# Patient Record
Sex: Male | Born: 1935 | Hispanic: No | Marital: Married | Smoking: Former smoker
Health system: Southern US, Community
[De-identification: ages and names within clinical notes are randomized; demographics above are authoritative.]

## PROBLEM LIST (undated history)

## (undated) DIAGNOSIS — E1140 Type 2 diabetes mellitus with diabetic neuropathy, unspecified: Secondary | ICD-10-CM

## (undated) DIAGNOSIS — D510 Vitamin B12 deficiency anemia due to intrinsic factor deficiency: Secondary | ICD-10-CM

## (undated) DIAGNOSIS — I2510 Atherosclerotic heart disease of native coronary artery without angina pectoris: Secondary | ICD-10-CM

## (undated) DIAGNOSIS — N183 Chronic kidney disease, stage 3 unspecified: Secondary | ICD-10-CM

## (undated) DIAGNOSIS — E119 Type 2 diabetes mellitus without complications: Secondary | ICD-10-CM

## (undated) DIAGNOSIS — C439 Malignant melanoma of skin, unspecified: Secondary | ICD-10-CM

## (undated) DIAGNOSIS — E11319 Type 2 diabetes mellitus with unspecified diabetic retinopathy without macular edema: Secondary | ICD-10-CM

## (undated) HISTORY — PX: CORONARY ARTERY BYPASS GRAFT: SHX141

## (undated) HISTORY — PX: APPENDECTOMY: SHX54

## (undated) HISTORY — PX: OTHER SURGICAL HISTORY: SHX169

## (undated) HISTORY — PX: FOOT SURGERY: SHX648

## (undated) HISTORY — PX: CARDIAC SURGERY: SHX584

## (undated) HISTORY — PX: TONSILLECTOMY: SUR1361

---

## 2004-08-23 ENCOUNTER — Emergency Department: Payer: Self-pay | Admitting: Emergency Medicine

## 2005-11-03 ENCOUNTER — Emergency Department: Payer: Self-pay | Admitting: Emergency Medicine

## 2005-11-03 ENCOUNTER — Other Ambulatory Visit: Payer: Self-pay

## 2006-06-13 ENCOUNTER — Emergency Department: Payer: Self-pay | Admitting: Emergency Medicine

## 2008-06-17 ENCOUNTER — Ambulatory Visit: Payer: Self-pay | Admitting: Cardiology

## 2008-06-17 ENCOUNTER — Ambulatory Visit: Payer: Self-pay | Admitting: Unknown Physician Specialty

## 2008-06-21 ENCOUNTER — Ambulatory Visit: Payer: Self-pay | Admitting: Unknown Physician Specialty

## 2008-10-19 ENCOUNTER — Inpatient Hospital Stay: Payer: Self-pay | Admitting: Internal Medicine

## 2008-11-03 ENCOUNTER — Encounter: Payer: Self-pay | Admitting: Internal Medicine

## 2008-11-28 ENCOUNTER — Encounter: Payer: Self-pay | Admitting: Internal Medicine

## 2010-04-30 HISTORY — PX: TOE AMPUTATION: SHX809

## 2010-07-17 ENCOUNTER — Other Ambulatory Visit: Payer: Self-pay | Admitting: Podiatry

## 2012-01-03 ENCOUNTER — Ambulatory Visit: Payer: Self-pay | Admitting: Oncology

## 2012-01-03 LAB — CBC CANCER CENTER
Basophil #: 0.1 x10 3/mm (ref 0.0–0.1)
Eosinophil %: 3.1 %
HCT: 41.4 % (ref 40.0–52.0)
HGB: 13.8 g/dL (ref 13.0–18.0)
Lymphocyte #: 3.4 x10 3/mm (ref 1.0–3.6)
Lymphocyte %: 51.2 %
MCV: 95 fL (ref 80–100)
Monocyte %: 8.5 %
Neutrophil #: 2.4 x10 3/mm (ref 1.4–6.5)
Neutrophil %: 36.2 %
RBC: 4.34 10*6/uL — ABNORMAL LOW (ref 4.40–5.90)
RDW: 13.7 % (ref 11.5–14.5)
WBC: 6.6 x10 3/mm (ref 3.8–10.6)

## 2012-01-03 LAB — COMPREHENSIVE METABOLIC PANEL
Albumin: 3.9 g/dL (ref 3.4–5.0)
Bilirubin,Total: 0.4 mg/dL (ref 0.2–1.0)
Calcium, Total: 9.3 mg/dL (ref 8.5–10.1)
Chloride: 105 mmol/L (ref 98–107)
Co2: 32 mmol/L (ref 21–32)
Creatinine: 1.45 mg/dL — ABNORMAL HIGH (ref 0.60–1.30)
EGFR (African American): 52 — ABNORMAL LOW
EGFR (Non-African Amer.): 45 — ABNORMAL LOW
Potassium: 4.3 mmol/L (ref 3.5–5.1)
SGOT(AST): 20 U/L (ref 15–37)
SGPT (ALT): 19 U/L (ref 12–78)
Sodium: 142 mmol/L (ref 136–145)

## 2012-01-09 ENCOUNTER — Ambulatory Visit: Payer: Self-pay | Admitting: Oncology

## 2012-01-10 ENCOUNTER — Ambulatory Visit: Payer: Self-pay | Admitting: Podiatry

## 2012-01-21 ENCOUNTER — Other Ambulatory Visit: Payer: Self-pay | Admitting: Podiatry

## 2012-01-29 ENCOUNTER — Ambulatory Visit: Payer: Self-pay | Admitting: Oncology

## 2012-02-20 LAB — COMPREHENSIVE METABOLIC PANEL
Albumin: 3.6 g/dL (ref 3.4–5.0)
Anion Gap: 8 (ref 7–16)
BUN: 15 mg/dL (ref 7–18)
Bilirubin,Total: 0.4 mg/dL (ref 0.2–1.0)
Calcium, Total: 9.2 mg/dL (ref 8.5–10.1)
Creatinine: 1.27 mg/dL (ref 0.60–1.30)
Glucose: 128 mg/dL — ABNORMAL HIGH (ref 65–99)
Potassium: 4.3 mmol/L (ref 3.5–5.1)
SGOT(AST): 18 U/L (ref 15–37)
SGPT (ALT): 17 U/L (ref 12–78)
Total Protein: 6.8 g/dL (ref 6.4–8.2)

## 2012-02-20 LAB — CBC CANCER CENTER
Basophil #: 0.1 x10 3/mm (ref 0.0–0.1)
Basophil %: 1.1 %
Eosinophil #: 0.2 x10 3/mm (ref 0.0–0.7)
Eosinophil %: 3 %
HGB: 13.1 g/dL (ref 13.0–18.0)
Lymphocyte #: 2.4 x10 3/mm (ref 1.0–3.6)
Lymphocyte %: 45.9 %
Monocyte %: 8.7 %
Neutrophil #: 2.1 x10 3/mm (ref 1.4–6.5)
Neutrophil %: 41.3 %
Platelet: 157 x10 3/mm (ref 150–440)
RBC: 4.11 10*6/uL — ABNORMAL LOW (ref 4.40–5.90)
WBC: 5.1 x10 3/mm (ref 3.8–10.6)

## 2012-02-29 ENCOUNTER — Ambulatory Visit: Payer: Self-pay | Admitting: Oncology

## 2012-06-28 ENCOUNTER — Ambulatory Visit: Payer: Self-pay | Admitting: Oncology

## 2012-07-29 ENCOUNTER — Ambulatory Visit: Payer: Self-pay | Admitting: Oncology

## 2012-07-29 LAB — COMPREHENSIVE METABOLIC PANEL
Alkaline Phosphatase: 92 U/L (ref 50–136)
Bilirubin,Total: 0.5 mg/dL (ref 0.2–1.0)
Calcium, Total: 8.5 mg/dL (ref 8.5–10.1)
Co2: 29 mmol/L (ref 21–32)
EGFR (African American): 50 — ABNORMAL LOW
EGFR (Non-African Amer.): 43 — ABNORMAL LOW
Glucose: 219 mg/dL — ABNORMAL HIGH (ref 65–99)
Potassium: 4.2 mmol/L (ref 3.5–5.1)
SGOT(AST): 18 U/L (ref 15–37)
SGPT (ALT): 16 U/L (ref 12–78)
Sodium: 140 mmol/L (ref 136–145)

## 2012-07-29 LAB — CBC CANCER CENTER
Basophil #: 0.1 x10 3/mm (ref 0.0–0.1)
Basophil %: 1.1 %
HCT: 39 % — ABNORMAL LOW (ref 40.0–52.0)
Lymphocyte #: 1.6 x10 3/mm (ref 1.0–3.6)
Lymphocyte %: 35.1 %
MCH: 31.9 pg (ref 26.0–34.0)
MCHC: 34.4 g/dL (ref 32.0–36.0)
MCV: 93 fL (ref 80–100)
Monocyte #: 0.3 x10 3/mm (ref 0.2–1.0)
Neutrophil #: 2.4 x10 3/mm (ref 1.4–6.5)
Neutrophil %: 52.8 %
Platelet: 150 x10 3/mm (ref 150–440)
RDW: 14.3 % (ref 11.5–14.5)

## 2012-08-28 ENCOUNTER — Ambulatory Visit: Payer: Self-pay | Admitting: Oncology

## 2013-01-26 ENCOUNTER — Ambulatory Visit: Payer: Self-pay | Admitting: Oncology

## 2013-01-28 ENCOUNTER — Ambulatory Visit: Payer: Self-pay | Admitting: Oncology

## 2013-01-28 LAB — CBC CANCER CENTER
Basophil #: 0.1 x10 3/mm (ref 0.0–0.1)
HCT: 38.9 % — ABNORMAL LOW (ref 40.0–52.0)
HGB: 13.2 g/dL (ref 13.0–18.0)
Lymphocyte #: 2.6 x10 3/mm (ref 1.0–3.6)
Lymphocyte %: 39.5 %
MCHC: 33.9 g/dL (ref 32.0–36.0)
Monocyte #: 0.6 x10 3/mm (ref 0.2–1.0)
Neutrophil %: 47.5 %
Platelet: 173 x10 3/mm (ref 150–440)
RBC: 4.12 10*6/uL — ABNORMAL LOW (ref 4.40–5.90)
RDW: 13.7 % (ref 11.5–14.5)
WBC: 6.6 x10 3/mm (ref 3.8–10.6)

## 2013-01-28 LAB — COMPREHENSIVE METABOLIC PANEL
Albumin: 3.5 g/dL (ref 3.4–5.0)
Anion Gap: 10 (ref 7–16)
BUN: 25 mg/dL — ABNORMAL HIGH (ref 7–18)
Creatinine: 1.44 mg/dL — ABNORMAL HIGH (ref 0.60–1.30)
EGFR (African American): 52 — ABNORMAL LOW
Osmolality: 293 (ref 275–301)
Potassium: 4.1 mmol/L (ref 3.5–5.1)
SGOT(AST): 16 U/L (ref 15–37)
Sodium: 143 mmol/L (ref 136–145)
Total Protein: 6.7 g/dL (ref 6.4–8.2)

## 2013-02-28 ENCOUNTER — Ambulatory Visit: Payer: Self-pay | Admitting: Oncology

## 2013-06-22 ENCOUNTER — Ambulatory Visit: Payer: Self-pay | Admitting: Podiatry

## 2013-06-22 LAB — BASIC METABOLIC PANEL
Anion Gap: 1 — ABNORMAL LOW (ref 7–16)
BUN: 16 mg/dL (ref 7–18)
CALCIUM: 9.2 mg/dL (ref 8.5–10.1)
CHLORIDE: 108 mmol/L — AB (ref 98–107)
Co2: 31 mmol/L (ref 21–32)
Creatinine: 1.01 mg/dL (ref 0.60–1.30)
EGFR (African American): >60
EGFR (Non-African Amer.): >60
Glucose: 83 mg/dL (ref 65–99)
OSMOLALITY: 280 (ref 275–301)
POTASSIUM: 4.8 mmol/L (ref 3.5–5.1)
Sodium: 140 mmol/L (ref 136–145)

## 2013-06-22 LAB — CBC WITH DIFFERENTIAL/PLATELET
BASOS PCT: 1.2 %
Basophil #: 0.1 10*3/uL (ref 0.0–0.1)
EOS PCT: 2.6 %
Eosinophil #: 0.2 10*3/uL (ref 0.0–0.7)
HCT: 36.5 % — AB (ref 40.0–52.0)
HGB: 12.4 g/dL — ABNORMAL LOW (ref 13.0–18.0)
LYMPHS ABS: 2.2 10*3/uL (ref 1.0–3.6)
Lymphocyte %: 35.8 %
MCH: 31.7 pg (ref 26.0–34.0)
MCHC: 33.8 g/dL (ref 32.0–36.0)
MCV: 94 fL (ref 80–100)
Monocyte #: 0.5 x10 3/mm (ref 0.2–1.0)
Monocyte %: 8.2 %
NEUTROS PCT: 52.2 %
Neutrophil #: 3.2 10*3/uL (ref 1.4–6.5)
Platelet: 157 10*3/uL (ref 150–440)
RBC: 3.9 10*6/uL — AB (ref 4.40–5.90)
RDW: 14.6 % — ABNORMAL HIGH (ref 11.5–14.5)
WBC: 6 10*3/uL (ref 3.8–10.6)

## 2013-06-25 ENCOUNTER — Ambulatory Visit: Payer: Self-pay | Admitting: Podiatry

## 2013-07-27 ENCOUNTER — Other Ambulatory Visit: Payer: Self-pay | Admitting: Podiatry

## 2013-07-29 ENCOUNTER — Ambulatory Visit: Payer: Self-pay | Admitting: Oncology

## 2013-07-31 LAB — WOUND CULTURE

## 2013-08-18 LAB — CBC CANCER CENTER
Basophil #: 0.1 x10 3/mm (ref 0.0–0.1)
Basophil %: 1.1 %
Eosinophil #: 0.3 x10 3/mm (ref 0.0–0.7)
Eosinophil %: 5.2 %
HCT: 40 % (ref 40.0–52.0)
HGB: 13.4 g/dL (ref 13.0–18.0)
Lymphocyte #: 2.1 x10 3/mm (ref 1.0–3.6)
Lymphocyte %: 40.1 %
MCH: 31.2 pg (ref 26.0–34.0)
MCHC: 33.4 g/dL (ref 32.0–36.0)
MCV: 93 fL (ref 80–100)
Monocyte #: 0.5 x10 3/mm (ref 0.2–1.0)
Monocyte %: 9.8 %
NEUTROS PCT: 43.8 %
Neutrophil #: 2.3 x10 3/mm (ref 1.4–6.5)
PLATELETS: 141 x10 3/mm — AB (ref 150–440)
RBC: 4.28 10*6/uL — AB (ref 4.40–5.90)
RDW: 13.9 % (ref 11.5–14.5)
WBC: 5.2 x10 3/mm (ref 3.8–10.6)

## 2013-08-18 LAB — COMPREHENSIVE METABOLIC PANEL
ALBUMIN: 3.6 g/dL (ref 3.4–5.0)
ALK PHOS: 85 U/L
ANION GAP: 7 (ref 7–16)
BUN: 18 mg/dL (ref 7–18)
Bilirubin,Total: 0.4 mg/dL (ref 0.2–1.0)
CHLORIDE: 106 mmol/L (ref 98–107)
CO2: 30 mmol/L (ref 21–32)
CREATININE: 1.27 mg/dL (ref 0.60–1.30)
Calcium, Total: 9.2 mg/dL (ref 8.5–10.1)
EGFR (Non-African Amer.): 52 — ABNORMAL LOW
GLUCOSE: 154 mg/dL — AB (ref 65–99)
OSMOLALITY: 290 (ref 275–301)
Potassium: 4.2 mmol/L (ref 3.5–5.1)
SGOT(AST): 13 U/L — ABNORMAL LOW (ref 15–37)
SGPT (ALT): 12 U/L (ref 12–78)
Sodium: 143 mmol/L (ref 136–145)
Total Protein: 6.9 g/dL (ref 6.4–8.2)

## 2013-08-28 ENCOUNTER — Ambulatory Visit: Payer: Self-pay | Admitting: Oncology

## 2014-02-23 ENCOUNTER — Ambulatory Visit: Payer: Self-pay | Admitting: Oncology

## 2014-02-23 LAB — CBC CANCER CENTER
Basophil #: 0.1 x10 3/mm (ref 0.0–0.1)
Basophil %: 1.2 %
Eosinophil #: 0.2 x10 3/mm (ref 0.0–0.7)
Eosinophil %: 3.3 %
HCT: 38.8 % — AB (ref 40.0–52.0)
HGB: 12.7 g/dL — ABNORMAL LOW (ref 13.0–18.0)
LYMPHS ABS: 1.8 x10 3/mm (ref 1.0–3.6)
Lymphocyte %: 35.8 %
MCH: 31.9 pg (ref 26.0–34.0)
MCHC: 32.8 g/dL (ref 32.0–36.0)
MCV: 98 fL (ref 80–100)
Monocyte #: 0.4 x10 3/mm (ref 0.2–1.0)
Monocyte %: 8.5 %
NEUTROS PCT: 51.2 %
Neutrophil #: 2.6 x10 3/mm (ref 1.4–6.5)
PLATELETS: 152 x10 3/mm (ref 150–440)
RBC: 3.98 10*6/uL — ABNORMAL LOW (ref 4.40–5.90)
RDW: 13.9 % (ref 11.5–14.5)
WBC: 5.1 x10 3/mm (ref 3.8–10.6)

## 2014-02-23 LAB — COMPREHENSIVE METABOLIC PANEL
ALBUMIN: 3.5 g/dL (ref 3.4–5.0)
Alkaline Phosphatase: 81 U/L
Anion Gap: 5 — ABNORMAL LOW (ref 7–16)
BUN: 16 mg/dL (ref 7–18)
Bilirubin,Total: 0.4 mg/dL (ref 0.2–1.0)
Calcium, Total: 8.6 mg/dL (ref 8.5–10.1)
Chloride: 106 mmol/L (ref 98–107)
Co2: 32 mmol/L (ref 21–32)
Creatinine: 1.33 mg/dL — ABNORMAL HIGH (ref 0.60–1.30)
EGFR (African American): >60
EGFR (Non-African Amer.): 55 — ABNORMAL LOW
Glucose: 183 mg/dL — ABNORMAL HIGH (ref 65–99)
Osmolality: 291 (ref 275–301)
Potassium: 4.6 mmol/L (ref 3.5–5.1)
SGOT(AST): 21 U/L (ref 15–37)
SGPT (ALT): 15 U/L
SODIUM: 143 mmol/L (ref 136–145)
Total Protein: 6.7 g/dL (ref 6.4–8.2)

## 2014-02-28 ENCOUNTER — Ambulatory Visit: Payer: Self-pay | Admitting: Oncology

## 2014-08-17 NOTE — Op Note (Signed)
PATIENT NAME:  Matthew Mcguire, Matthew Mcguire MR#:  [id number] DATE OF BIRTH:  1928-08-01  DATE OF PROCEDURE:  01/10/2012  PREOPERATIVE DIAGNOSIS: Melanoma of the right great toe.   POSTOPERATIVE DIAGNOSIS: Melanoma of the right great toe.   PROCEDURE: Right inguinal lymph node biopsy.   SURGEON: Rochel Brome, MD   ANESTHESIA: General, local.   INDICATIONS: This 81 year old male has a history of a bleeding wound of the right great toe. Dr. Elvina Mattes did a curette biopsy which demonstrated malignant melanoma. His PET scan was only positive in the toe. Sentinel lymph node biopsy was recommended for further evaluation.   DESCRIPTION OF PROCEDURE: The patient had preoperative injection of radioactive technetium sulfur colloid in his foot. I reviewed the scan which demonstrated activity in the right groin. There was an intense focus and then just slightly cephalad to that was slightly less focus.   The patient was placed on the operating table in the supine position. His foot was prepared for surgery by Dr. Elvina Mattes. The right groin was prepared with ChloraPrep and draped in a sterile manner. Dr. Elvina Mattes did a right great toe amputation simultaneously as I did the right inguinal lymph node biopsy. We had separate instruments and separate scrub technicians.   The gamma counter was used in the groin just below the inguinal crease to demonstrate location of radioactivity. This was just about 1.5 cm cephalad to a purple mark which had been placed in the skin. A longitudinally-oriented 4 cm incision was made, carried down through subcutaneous tissues through superficial fascia. A number of small bleeding points were cauterized. The femoral pulse could be felt deep to the site of dissection. The dissection was carried further down into the groin alternating with the use of the gamma counter demonstrating location of radioactivity. There was a lymph node encountered which appeared to be more than a centimeter in dimension  and also some additional lymph node tissue just somewhat proximal to this. Both of these were excised in one portion of tissue which was approximately 2.5 to 3 cm in length and was approximately 1.2 cm wide and 1.2 cm from anterior to posterior. This was submitted in formalin for routine pathology. One clamped bleeding point was suture ligated with 4-0 chromic. Following this, the site was further scanned with the gamma counter, and the background count was zero; so there was no remaining radioactivity found in the groin. Next, the hemostasis was determined to be intact. The fascia was closed with interrupted 4-0 chromic sutures, and the skin was closed with a running 5-0 Monocryl subcuticular suture and Dermabond.   The patient tolerated the procedure satisfactorily, and with both procedures completed the patient was prepared for transfer to the recovery room. Dr. Elvina Mattes  will create a separate operative note describing his procedure.   ____________________________ J. Rochel Brome, MD jws:cbb D: 01/10/2012 11:59:26 ET T: 01/10/2012 12:55:49 ET JOB#: [id number]  cc: Loreli Dollar, MD, <Dictator> Loreli Dollar MD ELECTRONICALLY SIGNED 01/10/2012 17:40

## 2014-08-17 NOTE — H&P (Signed)
PATIENT NAME:  Matthew Mcguire, Matthew Mcguire MR#:  [id number] DATE OF BIRTH:  05-19-1928  DATE OF ADMISSION:  01/10/2012  HISTORY OF PRESENT ILLNESS: This 81 year old has a chief complaint of a bleeding sore on his right great toe. This has been bleeding intermittently for nearly a year. He says it typically bleeds about every other month. He has not had any pain or discomfort. Does have history of peripheral neuropathy. Has fractured the small toe but did not have any pain with that either. He just recently saw Dr. Elvina Mattes and had a portion of tissue of the right great toe removed with curette and submitted for pathology which demonstrated malignant melanoma. He has had consultation by Dr. Oliva Bustard who has recommended sentinel lymph node biopsy. He has also had PET scanning which showed no abnormal metabolic activity other than in the tip of the right great toe. Patient reports no foot or leg pain. Does tend to get some swelling in both ankles which is chronic.   PAST MEDICAL HISTORY:  1. Diabetes which he has been aware of for some 56 years, has some history of diabetic retinopathy and also diabetic peripheral neuropathy. 2. History of pernicious anemia.  3. History of hypothyroidism, although not currently on treatment.  4. History of myocardial infarction in 2010, had coronary artery bypass graft June 2010, recently saw Dr. Ubaldo Glassing in the office on 08/12 and at that time was reviewed that he had had some dizziness and his metoprolol and lisinopril and hydrochlorothiazide were stopped and his dizziness improved. He had had no recent chest pain or shortness of breath.   PAST SURGICAL HISTORY:  1. Appendectomy. 2. Tonsillectomy. 3. Carpal tunnel surgery on the left. 4. Coronary artery bypass 2010. 5. Bilateral cataract surgery.  6. Surgery for Dupuytren's contracture right ring and small finger 2003. 7. Excision of left olecranon bursa 2004. 8. Release Dupuytren contracture left little finger 2004.    MEDICATIONS:  1. Aspirin 81 mg daily. 2. Vitamin B12 1000 mcg intramuscular each month. 3. Lantus insulin 30 units sub-Q at bedtime. 4. Humalog 5 to 10 units sub-Q t.i.d.    DRUG ALLERGIES: None known.   SOCIAL HISTORY: Does not smoke. Does drink some whiskey.   FAMILY HISTORY: Positive for diabetes in his sister. His brother had stroke.   REVIEW OF SYSTEMS: He reports he has been eating well, moving his bowels satisfactorily, voiding satisfactorily. Has some mild difficulty with vision and hearing. He reports no recent acute illness such as cough, cold, sore throat. No dyspnea on exertion. No chest pain. He is moving his bowels satisfactorily, voiding satisfactorily. Does have some chronic bilateral ankle edema, numbness in both feet. Has not noted any recent sores or boils. Review of systems otherwise negative.    PHYSICAL EXAMINATION:  GENERAL: He is awake, alert, oriented, ambulatory, in no acute distress.   VITAL SIGNS: Blood pressure 128/68, pulse 50, respirations 12, oxygen saturation 98%.   SKIN: Warm, dry, tanned without rash.   HEENT: Pupils equal, reactive to light. Extraocular movements are intact. Sclerae clear. Pharynx clear.   NECK: No palpable mass.   LUNGS: Lungs sounds are clear.   HEART: Regular rhythm, S1, S2.   ABDOMEN: Obese and soft and nontender with no palpable mass. There is no inguinal adenopathy. Femoral pulses are strong and intact. There is no unusual swelling in the groin.   EXTREMITIES: Has mild bilateral ankle edema. His right dorsalis pedis pulse is intact. There is some loss of the medial aspect of his  great toenail with a healing wound of the nail bed. There is a trace of blood in the immediate area and some postbiopsy changes with scarring. His feet are anesthetic.   NEUROLOGIC: Awake, alert and oriented, moving all extremities   LABORATORY, DIAGNOSTIC AND RADIOLOGICAL DATA: I reviewed his sentinel lymph node scan which does demonstrate  activity in the groin consistent with lymph node and also there may be a second lymph node just about an inch away which is less apparent.  DIAGNOSIS: Melanoma of the right great toe.   NOTE: Dr. Elvina Mattes plans a right great toe amputation.   RECOMMENDATIONS: I recommended a sentinel lymph node biopsy. I discussed operation, care, risks and benefits with him in detail and have discussed doing that under general anesthesia and so we are arranging for his surgery.  ____________________________ Lenna Sciara. Rochel Brome, MD jws:cms D: 01/10/2012 10:52:21 ET T: 01/10/2012 11:09:38 ET JOB#: [id number]  cc: Loreli Dollar, MD, <Dictator> Loreli Dollar MD ELECTRONICALLY SIGNED 01/10/2012 17:40

## 2014-08-17 NOTE — Op Note (Signed)
PATIENT NAME:  CELVIN, TANEY MR#:  [id number] DATE OF BIRTH:  [birth year]  DATE OF PROCEDURE:  01/10/2012  PREOPERATIVE DIAGNOSIS: Malignant melanoma, right hallux nail area.   POSTOPERATIVE DIAGNOSIS: Malignant melanoma, right hallux nail area.  PROCEDURE PERFORMED:   1. Amputation to metatarsophalangeal joint level of the right hallux.  2. Sentinel node biopsy done by Dr. Tamala Julian in the right groin.   SURGEON: Albertine Patricia, DPM (first procedure)  SURGEON: Rochel Brome, MD  (second procedure)  ANESTHESIA: General with local assistance.   ESTIMATED BLOOD LOSS: Negligible.   HEMOSTASIS: Ankle tourniquet to 250 mmHg pressure.   DESCRIPTION OF PROCEDURE: The patient was brought to the Operating Room and placed on the Operating Room table in the supine position. After general anesthesia was achieved, I gave the patient some Marcaine around the base of the toe, a metatarsal block essentially in the first metatarsal area. The patient was then prepped and draped in the usual sterile manner both at the right foot and with Dr. Tamala Julian. He will dictate his procedure separately. After sterile prep and drape, attention was directed to the right great toe. A skin marker was used to measure approximately 5 cm proximal to the area. This allowed me to amputate the toe just distal to the MTP joint level. I left some skin to be able to close the wound primarily. At this point, the incision was taken down all the way to bone. The soft tissue was then freed away from the bone, including the extensor and flexor tendons. Joint level was identified, and tendon and capsular tissue was then released. The tibial and fibular sesamoid were also resected at this timeframe to allow less pressure submet one on the foot when he was walking on it. The toe was then sent for pathology along with the sesamoids. The area was then copiously irrigated. Clean margins were noted, and 3-0 Vicryl was used to close deep and superficial  fascia in a simple interrupted fashion. Skin was then closed with 3-0 nylon in a combination of horizontal mattress, vertical mattress, and simple interrupted sutures. A sterile compressive dressing was placed across the wound consisting of Xeroform gauze, 4 x 4's, Kling and Kerlix. The tourniquet had been released prior to closure, and there was one arterial bleeder that was tied off and cauterized. Other bleeding was just venous. This was cauterized as needed. The patient appeared to tolerate the procedure and anesthesia well and left the Operating Room for the recovery room with vital signs stable and neurovascular status intact. Dr. Tamala Julian completed his sentinel node biopsy about the same time I finished his great toe, and he will dictate that separately.  ____________________________ Gerrit Heck Maria Gallicchio, DPM mgt:cbb D: 01/10/2012 12:05:56 ET T: 01/10/2012 13:03:47 ET JOB#: [id number]  cc: Gerrit Heck Jaydy Fitzhenry, DPM, <Dictator> Perry Mount MD ELECTRONICALLY SIGNED 01/15/2012 12:51

## 2014-08-24 ENCOUNTER — Ambulatory Visit
Admit: 2014-08-24 | Disposition: A | Discharge status: Home / Self Care | Payer: Self-pay | Attending: Oncology | Admitting: Oncology

## 2014-08-26 LAB — COMPREHENSIVE METABOLIC PANEL
AST: 23 U/L
Albumin: 4 g/dL
Alkaline Phosphatase: 72 U/L
Anion Gap: 5 — ABNORMAL LOW (ref 7–16)
BUN: 18 mg/dL
Bilirubin,Total: 0.9 mg/dL
CHLORIDE: 104 mmol/L
Calcium, Total: 9.2 mg/dL
Co2: 29 mmol/L
Creatinine: 1.25 mg/dL — ABNORMAL HIGH
GFR CALC NON AF AMER: 53 — AB
Glucose: 120 mg/dL — ABNORMAL HIGH
Potassium: 4.1 mmol/L
SGPT (ALT): 12 U/L — ABNORMAL LOW
Sodium: 138 mmol/L
TOTAL PROTEIN: 7.2 g/dL

## 2014-08-26 LAB — CBC CANCER CENTER
BASOS PCT: 1.1 %
Basophil #: 0.1 x10 3/mm (ref 0.0–0.1)
EOS PCT: 3.9 %
Eosinophil #: 0.2 x10 3/mm (ref 0.0–0.7)
HCT: 40.2 % (ref 40.0–52.0)
HGB: 13.5 g/dL (ref 13.0–18.0)
Lymphocyte #: 2.1 x10 3/mm (ref 1.0–3.6)
Lymphocyte %: 36.2 %
MCH: 31 pg (ref 26.0–34.0)
MCHC: 33.7 g/dL (ref 32.0–36.0)
MCV: 92 fL (ref 80–100)
MONO ABS: 0.5 x10 3/mm (ref 0.2–1.0)
Monocyte %: 8.1 %
Neutrophil #: 3 x10 3/mm (ref 1.4–6.5)
Neutrophil %: 50.7 %
PLATELETS: 155 x10 3/mm (ref 150–440)
RBC: 4.37 10*6/uL — AB (ref 4.40–5.90)
RDW: 14 % (ref 11.5–14.5)
WBC: 5.9 x10 3/mm (ref 3.8–10.6)

## 2014-11-09 ENCOUNTER — Other Ambulatory Visit: Payer: Self-pay | Admitting: Internal Medicine

## 2014-11-09 DIAGNOSIS — M542 Cervicalgia: Secondary | ICD-10-CM

## 2014-11-09 DIAGNOSIS — M5030 Other cervical disc degeneration, unspecified cervical region: Secondary | ICD-10-CM

## 2014-11-16 ENCOUNTER — Ambulatory Visit
Admission: RE | Admit: 2014-11-16 | Discharge: 2014-11-16 | Disposition: A | Discharge status: Home / Self Care | Payer: Medicare Other | Source: Ambulatory Visit | Attending: Internal Medicine | Admitting: Internal Medicine

## 2014-11-16 DIAGNOSIS — M5382 Other specified dorsopathies, cervical region: Secondary | ICD-10-CM | POA: Insufficient documentation

## 2014-11-16 DIAGNOSIS — M542 Cervicalgia: Secondary | ICD-10-CM | POA: Diagnosis present

## 2014-11-16 DIAGNOSIS — M4802 Spinal stenosis, cervical region: Secondary | ICD-10-CM | POA: Diagnosis not present

## 2014-11-16 DIAGNOSIS — M2578 Osteophyte, vertebrae: Secondary | ICD-10-CM | POA: Insufficient documentation

## 2014-11-16 DIAGNOSIS — M47892 Other spondylosis, cervical region: Secondary | ICD-10-CM | POA: Diagnosis not present

## 2014-11-16 DIAGNOSIS — M5030 Other cervical disc degeneration, unspecified cervical region: Secondary | ICD-10-CM

## 2015-02-20 ENCOUNTER — Encounter: Payer: Self-pay | Admitting: Emergency Medicine

## 2015-02-20 ENCOUNTER — Emergency Department: Payer: Medicare Other

## 2015-02-20 ENCOUNTER — Inpatient Hospital Stay
Admission: EM | Admit: 2015-02-20 | Discharge: 2015-02-23 | DRG: 603 | Disposition: A | Discharge status: Skilled Nursing Facility | Payer: Medicare Other | Attending: Internal Medicine | Admitting: Internal Medicine

## 2015-02-20 DIAGNOSIS — E10319 Type 1 diabetes mellitus with unspecified diabetic retinopathy without macular edema: Secondary | ICD-10-CM | POA: Diagnosis present

## 2015-02-20 DIAGNOSIS — N183 Chronic kidney disease, stage 3 (moderate): Secondary | ICD-10-CM | POA: Diagnosis present

## 2015-02-20 DIAGNOSIS — Z89411 Acquired absence of right great toe: Secondary | ICD-10-CM | POA: Diagnosis not present

## 2015-02-20 DIAGNOSIS — E1040 Type 1 diabetes mellitus with diabetic neuropathy, unspecified: Secondary | ICD-10-CM | POA: Diagnosis present

## 2015-02-20 DIAGNOSIS — L97519 Non-pressure chronic ulcer of other part of right foot with unspecified severity: Secondary | ICD-10-CM | POA: Diagnosis present

## 2015-02-20 DIAGNOSIS — Z951 Presence of aortocoronary bypass graft: Secondary | ICD-10-CM

## 2015-02-20 DIAGNOSIS — I2510 Atherosclerotic heart disease of native coronary artery without angina pectoris: Secondary | ICD-10-CM | POA: Diagnosis present

## 2015-02-20 DIAGNOSIS — D510 Vitamin B12 deficiency anemia due to intrinsic factor deficiency: Secondary | ICD-10-CM | POA: Diagnosis present

## 2015-02-20 DIAGNOSIS — Z79899 Other long term (current) drug therapy: Secondary | ICD-10-CM

## 2015-02-20 DIAGNOSIS — Z794 Long term (current) use of insulin: Secondary | ICD-10-CM | POA: Diagnosis not present

## 2015-02-20 DIAGNOSIS — E10621 Type 1 diabetes mellitus with foot ulcer: Secondary | ICD-10-CM | POA: Diagnosis present

## 2015-02-20 DIAGNOSIS — Z85820 Personal history of malignant melanoma of skin: Secondary | ICD-10-CM

## 2015-02-20 DIAGNOSIS — Z7982 Long term (current) use of aspirin: Secondary | ICD-10-CM

## 2015-02-20 DIAGNOSIS — M79671 Pain in right foot: Secondary | ICD-10-CM | POA: Diagnosis present

## 2015-02-20 DIAGNOSIS — L03031 Cellulitis of right toe: Secondary | ICD-10-CM | POA: Diagnosis present

## 2015-02-20 DIAGNOSIS — E1022 Type 1 diabetes mellitus with diabetic chronic kidney disease: Secondary | ICD-10-CM | POA: Diagnosis present

## 2015-02-20 DIAGNOSIS — L089 Local infection of the skin and subcutaneous tissue, unspecified: Secondary | ICD-10-CM

## 2015-02-20 DIAGNOSIS — Z720 Tobacco use: Secondary | ICD-10-CM

## 2015-02-20 DIAGNOSIS — L97509 Non-pressure chronic ulcer of other part of unspecified foot with unspecified severity: Secondary | ICD-10-CM | POA: Diagnosis present

## 2015-02-20 HISTORY — DX: Malignant melanoma of skin, unspecified: C43.9

## 2015-02-20 HISTORY — DX: Type 2 diabetes mellitus with unspecified diabetic retinopathy without macular edema: E11.319

## 2015-02-20 HISTORY — DX: Chronic kidney disease, stage 3 (moderate): N18.3

## 2015-02-20 HISTORY — DX: Type 2 diabetes mellitus without complications: E11.9

## 2015-02-20 HISTORY — DX: Vitamin B12 deficiency anemia due to intrinsic factor deficiency: D51.0

## 2015-02-20 HISTORY — DX: Atherosclerotic heart disease of native coronary artery without angina pectoris: I25.10

## 2015-02-20 HISTORY — DX: Type 2 diabetes mellitus with diabetic neuropathy, unspecified: E11.40

## 2015-02-20 HISTORY — DX: Chronic kidney disease, stage 3 unspecified: N18.30

## 2015-02-20 LAB — CBC WITH DIFFERENTIAL/PLATELET
BASOS PCT: 1 %
Basophils Absolute: 0.1 10*3/uL (ref 0–0.1)
EOS ABS: 0.2 10*3/uL (ref 0–0.7)
EOS PCT: 3 %
HCT: 33.3 % — ABNORMAL LOW (ref 40.0–52.0)
Hemoglobin: 11.3 g/dL — ABNORMAL LOW (ref 13.0–18.0)
LYMPHS ABS: 1.7 10*3/uL (ref 1.0–3.6)
Lymphocytes Relative: 27 %
MCH: 32.2 pg (ref 26.0–34.0)
MCHC: 33.9 g/dL (ref 32.0–36.0)
MCV: 95 fL (ref 80.0–100.0)
Monocytes Absolute: 0.6 10*3/uL (ref 0.2–1.0)
Monocytes Relative: 9 %
NEUTROS PCT: 60 %
Neutro Abs: 3.8 10*3/uL (ref 1.4–6.5)
PLATELETS: 110 10*3/uL — AB (ref 150–440)
RBC: 3.5 MIL/uL — AB (ref 4.40–5.90)
RDW: 13.8 % (ref 11.5–14.5)
WBC: 6.3 10*3/uL (ref 3.8–10.6)

## 2015-02-20 LAB — COMPREHENSIVE METABOLIC PANEL
ALBUMIN: 3.7 g/dL (ref 3.5–5.0)
ALT: 12 U/L — AB (ref 17–63)
AST: 21 U/L (ref 15–41)
Alkaline Phosphatase: 72 U/L (ref 38–126)
Anion gap: 5 (ref 5–15)
BUN: 24 mg/dL — AB (ref 6–20)
CHLORIDE: 108 mmol/L (ref 101–111)
CO2: 27 mmol/L (ref 22–32)
CREATININE: 1.39 mg/dL — AB (ref 0.61–1.24)
Calcium: 9 mg/dL (ref 8.9–10.3)
GFR calc Af Amer: 52 mL/min — ABNORMAL LOW (ref >60)
GFR, EST NON AFRICAN AMERICAN: 45 mL/min — AB (ref >60)
GLUCOSE: 144 mg/dL — AB (ref 65–99)
POTASSIUM: 4.2 mmol/L (ref 3.5–5.1)
SODIUM: 140 mmol/L (ref 135–145)
Total Bilirubin: 0.5 mg/dL (ref 0.3–1.2)
Total Protein: 6.7 g/dL (ref 6.5–8.1)

## 2015-02-20 LAB — GLUCOSE, CAPILLARY
Glucose-Capillary: 140 mg/dL — ABNORMAL HIGH (ref 65–99)
Glucose-Capillary: 230 mg/dL — ABNORMAL HIGH (ref 65–99)

## 2015-02-20 LAB — LACTIC ACID, PLASMA
Lactic Acid, Venous: 1.3 mmol/L (ref 0.5–2.0)
Lactic Acid, Venous: 1.2 mmol/L (ref 0.5–2.0)

## 2015-02-20 LAB — SEDIMENTATION RATE: Sed Rate: 17 mm/hr (ref 0–20)

## 2015-02-20 MED ORDER — ACETAMINOPHEN 325 MG PO TABS: 650.0000 mg | ORAL_TABLET | Freq: Four times a day (QID) | ORAL | Status: DC | PRN

## 2015-02-20 MED ORDER — VANCOMYCIN HCL IN DEXTROSE 1-5 GM/200ML-% IV SOLN
1000.0000 mg | Freq: Once | INTRAVENOUS | Status: AC
  Administered 2015-02-20: 1000 mg via INTRAVENOUS
  Filled 2015-02-20: qty 200

## 2015-02-20 MED ORDER — INSULIN ASPART 100 UNIT/ML ~~LOC~~ SOLN
3.0000 [IU] | Freq: Three times a day (TID) | SUBCUTANEOUS | Status: DC
  Administered 2015-02-21 – 2015-02-23 (×7): 3 [IU] via SUBCUTANEOUS
  Filled 2015-02-20 (×7): qty 3

## 2015-02-20 MED ORDER — SILVER SULFADIAZINE 1 % EX CREA
TOPICAL_CREAM | Freq: Every day | CUTANEOUS | Status: DC
  Administered 2015-02-20 – 2015-02-23 (×4): via TOPICAL
  Filled 2015-02-20: qty 85

## 2015-02-20 MED ORDER — GABAPENTIN 400 MG PO CAPS
800.0000 mg | ORAL_CAPSULE | Freq: Three times a day (TID) | ORAL | Status: DC
  Administered 2015-02-20 – 2015-02-23 (×9): 800 mg via ORAL
  Filled 2015-02-20 (×9): qty 2

## 2015-02-20 MED ORDER — ACETAMINOPHEN 650 MG RE SUPP: 650.0000 mg | Freq: Four times a day (QID) | RECTAL | Status: DC | PRN

## 2015-02-20 MED ORDER — PIPERACILLIN-TAZOBACTAM 3.375 G IVPB
3.3750 g | Freq: Three times a day (TID) | INTRAVENOUS | Status: DC
  Administered 2015-02-21 – 2015-02-23 (×6): 3.375 g via INTRAVENOUS
  Filled 2015-02-20 (×9): qty 50

## 2015-02-20 MED ORDER — VANCOMYCIN HCL IN DEXTROSE 1-5 GM/200ML-% IV SOLN
1000.0000 mg | INTRAVENOUS | Status: DC
  Administered 2015-02-21 – 2015-02-23 (×3): 1000 mg via INTRAVENOUS
  Filled 2015-02-20 (×4): qty 200

## 2015-02-20 MED ORDER — LISINOPRIL 5 MG PO TABS: 2.5000 mg | ORAL_TABLET | Freq: Every day | ORAL | Status: DC

## 2015-02-20 MED ORDER — VANCOMYCIN HCL 10 G IV SOLR: 1.0000 g | Freq: Once | INTRAVENOUS | Status: DC

## 2015-02-20 MED ORDER — INSULIN ASPART 100 UNIT/ML ~~LOC~~ SOLN
0.0000 [IU] | Freq: Every day | SUBCUTANEOUS | Status: DC
  Administered 2015-02-20 – 2015-02-22 (×3): 2 [IU] via SUBCUTANEOUS
  Filled 2015-02-20 (×3): qty 2

## 2015-02-20 MED ORDER — ONDANSETRON HCL 4 MG/2ML IJ SOLN: 4.0000 mg | Freq: Four times a day (QID) | INTRAMUSCULAR | Status: DC | PRN

## 2015-02-20 MED ORDER — INSULIN GLARGINE 100 UNIT/ML ~~LOC~~ SOLN
30.0000 [IU] | Freq: Every day | SUBCUTANEOUS | Status: DC
  Administered 2015-02-20 – 2015-02-22 (×3): 30 [IU] via SUBCUTANEOUS
  Filled 2015-02-20 (×4): qty 0.3

## 2015-02-20 MED ORDER — MELOXICAM 7.5 MG PO TABS
7.5000 mg | ORAL_TABLET | Freq: Every day | ORAL | Status: DC
  Administered 2015-02-21 – 2015-02-23 (×3): 7.5 mg via ORAL
  Filled 2015-02-20 (×3): qty 1

## 2015-02-20 MED ORDER — VITAMIN D 1000 UNITS PO TABS
1000.0000 [IU] | ORAL_TABLET | Freq: Every day | ORAL | Status: DC
  Administered 2015-02-21 – 2015-02-23 (×3): 1000 [IU] via ORAL
  Filled 2015-02-20 (×4): qty 1

## 2015-02-20 MED ORDER — INSULIN ASPART 100 UNIT/ML ~~LOC~~ SOLN
0.0000 [IU] | Freq: Three times a day (TID) | SUBCUTANEOUS | Status: DC
  Administered 2015-02-21: 2 [IU] via SUBCUTANEOUS
  Administered 2015-02-22: 3 [IU] via SUBCUTANEOUS
  Administered 2015-02-22: 2 [IU] via SUBCUTANEOUS
  Administered 2015-02-22: 5 [IU] via SUBCUTANEOUS
  Filled 2015-02-20: qty 5
  Filled 2015-02-20: qty 2
  Filled 2015-02-20: qty 3
  Filled 2015-02-20: qty 2

## 2015-02-20 MED ORDER — PIPERACILLIN-TAZOBACTAM 3.375 G IVPB
3.3750 g | Freq: Once | INTRAVENOUS | Status: AC
  Administered 2015-02-20: 3.375 g via INTRAVENOUS
  Filled 2015-02-20: qty 50

## 2015-02-20 MED ORDER — ONDANSETRON HCL 4 MG PO TABS: 4.0000 mg | ORAL_TABLET | Freq: Four times a day (QID) | ORAL | Status: DC | PRN

## 2015-02-20 NOTE — ED Notes (Signed)
Pt has multiple areas that are weeping serosanginous fluid. Large fluid filled blister to right great toe area. Right great toe is amputated (4 years ago).  Feet are reddened and flaking.

## 2015-02-20 NOTE — Consult Note (Signed)
Reason for Consult: Bleeding and drainage from the right foot. Diabetic with neuropathy Referring Physician: [facility]  Matthew Mcguire is an 81 y.o. male.  HPI: The patient states that he was bending over last night to get something. He denies any injury to his feet. His wife noticed some bleeding on the floor area. Continued to have some significant drainage and bleeding from the wounds and presented to the emergency department for evaluation. On further questioning the patient denies any known history of his feet being near any type of heat source.  Past Medical History  Diagnosis Date  . Diabetes (HCC)     type 1 DM  . CAD (coronary artery disease)     s/p CABG  . Diabetic neuropathy (HCC)   . Diabetic retinopathy (HCC)   . CKD (chronic kidney disease) stage 3, GFR 30-59 ml/min   . Malignant melanoma (HCC)   . Pernicious anemia     Past Surgical History  Procedure Laterality Date  . Coronary artery bypass graft    . Foot surgery    . Toe amputation  2012    great toe on right foot due to melanoma  . Dupuytrens contracture      release on left little finger  . Appendectomy    . Tonsillectomy    . Cataract repair      bilateral  . Fasiectomy      for dupuytrens contracture of right hand    Family History  Problem Relation Age of Onset  . Pulmonary embolism Mother   . CVA Father     Social History:  reports that he has quit smoking. His smokeless tobacco use includes Chew. He reports that he does not drink alcohol or use illicit drugs.  Allergies: No Known Allergies  Medications: I have reviewed the patient's current medications.  Results for orders placed or performed during the hospital encounter of 02/20/15 (from the past 48 hour(s))  CBC with Differential/Platelet     Status: Abnormal   Collection Time: 02/20/15  2:02 PM  Result Value Ref Range   WBC 6.3 3.8 - 10.6 K/uL   RBC 3.50 (L) 4.40 - 5.90 MIL/uL   Hemoglobin 11.3 (L) 13.0 - 18.0  g/dL   HCT 33.3 (L) 40.0 - 52.0 %   MCV 95.0 80.0 - 100.0 fL   MCH 32.2 26.0 - 34.0 pg   MCHC 33.9 32.0 - 36.0 g/dL   RDW 13.8 11.5 - 14.5 %   Platelets 110 (L) 150 - 440 K/uL   Neutrophils Relative % 60 %   Neutro Abs 3.8 1.4 - 6.5 K/uL   Lymphocytes Relative 27 %   Lymphs Abs 1.7 1.0 - 3.6 K/uL   Monocytes Relative 9 %   Monocytes Absolute 0.6 0.2 - 1.0 K/uL   Eosinophils Relative 3 %   Eosinophils Absolute 0.2 0 - 0.7 K/uL   Basophils Relative 1 %   Basophils Absolute 0.1 0 - 0.1 K/uL  Comprehensive metabolic panel     Status: Abnormal   Collection Time: 02/20/15  2:02 PM  Result Value Ref Range   Sodium 140 135 - 145 mmol/L   Potassium 4.2 3.5 - 5.1 mmol/L   Chloride 108 101 - 111 mmol/L   CO2 27 22 - 32 mmol/L   Glucose, Bld 144 (H) 65 - 99 mg/dL   BUN 24 (H) 6 - 20 mg/dL   Creatinine, Ser 1.39 (H) 0.61 - 1.24 mg/dL   Calcium 9.0 8.9 -   10.3 mg/dL   Total Protein 6.7 6.5 - 8.1 g/dL   Albumin 3.7 3.5 - 5.0 g/dL   AST 21 15 - 41 U/L   ALT 12 (L) 17 - 63 U/L   Alkaline Phosphatase 72 38 - 126 U/L   Total Bilirubin 0.5 0.3 - 1.2 mg/dL   GFR calc non Af Amer 45 (L) >60 mL/min   GFR calc Af Amer 52 (L) >60 mL/min    Comment: (NOTE) The eGFR has been calculated using the CKD EPI equation. This calculation has not been validated in all clinical situations. eGFR's persistently <60 mL/min signify possible Chronic Kidney Disease.    Anion gap 5 5 - 15  Lactic acid, plasma     Status: None   Collection Time: 02/20/15  2:02 PM  Result Value Ref Range   Lactic Acid, Venous 1.3 0.5 - 2.0 mmol/L  Sedimentation rate     Status: None   Collection Time: 02/20/15  2:02 PM  Result Value Ref Range   Sed Rate 17 0 - 20 mm/hr  Lactic acid, plasma     Status: None   Collection Time: 02/20/15  5:05 PM  Result Value Ref Range   Lactic Acid, Venous 1.2 0.5 - 2.0 mmol/L  Glucose, capillary     Status: Abnormal   Collection Time: 02/20/15  6:15 PM  Result Value Ref Range    Glucose-Capillary 140 (H) 65 - 99 mg/dL   Comment 1 Notify RN     Dg Foot 2 Views Right  02/20/2015  CLINICAL DATA:  Foot redness/swelling with weeping, status post 1st digit amputation EXAM: RIGHT FOOT - 2 VIEW COMPARISON:  None. FINDINGS: Status post 1st digit amputation. Soft tissue swelling/edema at the surgical margin. No underlying cortical irregularity to suggest acute osteomyelitis. Fracture involving the distal aspect of the second proximal phalanx, likely chronic. The joint spaces are preserved. No radiopaque foreign body is seen. IMPRESSION: Status post 1st digit amputation. Associated soft tissue swelling/edema at the surgical margin, correlate for cellulitis. No underlying cortical irregularity to suggest acute osteomyelitis. Fracture involving the distal aspect of the second proximal phalanx, likely chronic. No radiopaque foreign body is seen. Electronically Signed   By: Julian Hy M.D.   On: 02/20/2015 15:23    Review of Systems  Constitutional: Positive for chills and malaise/fatigue.  HENT: Negative.   Eyes: Positive for blurred vision.  Respiratory: Negative.   Cardiovascular: Positive for leg swelling.  Gastrointestinal: Negative.   Genitourinary: Negative.   Musculoskeletal: Positive for myalgias.  Skin:       Relates bleeding and drainage from his right foot. Has some swelling in both feet from kidney failure  Neurological:       Neuropathy in both of his feet related to his diabetes.  Endo/Heme/Allergies: Bruises/bleeds easily.  Psychiatric/Behavioral: Negative.    Blood pressure 134/64, pulse 79, temperature 97.8 F (36.6 C), temperature source Oral, resp. rate 16, height 6' (1.829 m), weight 110.678 kg (244 lb), SpO2 100 %. Physical Exam  Cardiovascular:  DP pulse is palpable bilateral. PT pulses are diminished on the right and not clearly palpable on the left.  Musculoskeletal:  Adequate range of motion of the pedal joints. Muscle testing is deferred.  Previous amputation of the right great toe  Neurological:  Loss of protective threshold with monofilament wire in the toes. Proprioception is impaired  Skin:  The skin is warm dry and somewhat atrophic. Some bilateral edema is noted. Large hemorrhagic blister formation is noted on  the entire right distal forefoot and remaining toes. Upon debridement of all the blisters there is no deep extension through the superficial tissues and it appears to be mostly just blister formation. No signs of purulence. Also a large blister formation along the left forefoot and toes involving all of the digits. Again no signs of purulence and no deep extension through the superficial tissues    Assessment/Plan: Assessment: Bullous blister formation involving the forefoot and toes of both feet. Diabetes with neuropathy  Plan: The removed and debrided all of the blister formation on the forefoot and toes of both feet. Removed the second and third toenails of the right foot without the need for anesthesia due to his neuropathy. We will begin daily Silvadene gauze dressings to the ulcerative areas on both feet. Continue to monitor throughout his hospital stay.  Ryiah Bellissimo W. 02/20/2015, 8:17 PM

## 2015-02-20 NOTE — ED Notes (Signed)
Pt states at approx 0300 both of his feet started weeping. Pt presents with wetness noted to both socks at this time. On pt's R foot there is a golf-ball sized area where big toe should be (amputated d/t cancer) that is noted to be purple in color. Weeping noted to the bottom of that foot as well as redness and swelling to other toes. Pt has dressing on 2nd toe due to it "busting open this morning". Pt's L foot noted to be red and weeping as well with what appears to be a burst blister to the bottom of his foot. Pt states he has been a diabetic for 58 years.

## 2015-02-20 NOTE — ED Notes (Signed)
Notified floor that patient is on his way up

## 2015-02-20 NOTE — H&P (Signed)
[facility] [place] NAME: Matthew Mcguire    MR#:  [id number]  DATE OF BIRTH:  21-Apr-1929  DATE OF ADMISSION:  02/20/2015  PRIMARY CARE PHYSICIAN: Idelle Crouch, MD   REQUESTING/REFERRING PHYSICIAN: Dr. Meade Maw  CHIEF COMPLAINT:   Chief Complaint  Patient presents with  . Foot Pain    HISTORY OF PRESENT ILLNESS:  Matthew Mcguire  is a 81 y.o. male with a known history of  type 1 diabetes mellitus on insulin, CAD, neuropathy and retinopathy, CKD  comes with sudden onset of bleeding blisters on both his feet. Patient has neuropathy and chronic feet issues for which he is following up with the podiatrist, he has fungal infection of his nails which are being taken care of by them. He denies noting any blisters prior to this. He has his right foot big toe amputated from malignant melanoma in the past. Denies any trauma to the feet. The only thing he remembers is he squatted down on his both feet yesterday. Then he got up and noted some blood trailing his way to the bedroom. Wife noted this morning that she had huge bloody blisters on the right foot draining bloody material and also serous drainage coming from the left foot which does not have bloody blisters but looks swollen from the bottom. Denies any fevers but he had complain of some chills. He takes only aspirin and not on any other anticoagulation. Denies any prior bloody blisters like this before ever.  PAST MEDICAL HISTORY:   Past Medical History  Diagnosis Date  . Diabetes ([place])     type 1 DM  . CAD (coronary artery disease)     s/p CABG  . Diabetic neuropathy ([place])   . Diabetic retinopathy ([place])   . CKD (chronic kidney disease) stage 3, GFR 30-59 ml/min   . Malignant melanoma ([place])   . Pernicious anemia     PAST SURGICAL HISTORY:   Past Surgical History  Procedure Laterality Date  . Coronary artery bypass graft    . Foot surgery    . Toe amputation  2012     great toe on right foot due to melanoma  . Dupuytrens contracture      release on left little finger  . Appendectomy    . Tonsillectomy    . Cataract repair      bilateral  . Fasiectomy      for dupuytrens contracture of right hand    SOCIAL HISTORY:   Social History  Substance Use Topics  . Smoking status: Former Research scientist (life sciences)  . Smokeless tobacco: Current User    Types: Chew  . Alcohol Use: No    FAMILY HISTORY:   Family History  Problem Relation Age of Onset  . Pulmonary embolism Mother   . CVA Father     DRUG ALLERGIES:  No Known Allergies  REVIEW OF SYSTEMS:   Review of Systems  Constitutional: Positive for chills. Negative for fever, weight loss and malaise/fatigue.  HENT: Negative for ear discharge, ear pain, hearing loss, nosebleeds and tinnitus.   Eyes: Positive for blurred vision. Negative for double vision and photophobia.  Respiratory: Negative for cough, hemoptysis, shortness of breath and wheezing.   Cardiovascular: Negative for chest pain, palpitations, orthopnea and leg swelling.  Gastrointestinal: Negative for heartburn, nausea, vomiting, abdominal pain, diarrhea, constipation and melena.  Genitourinary: Negative for dysuria, urgency, frequency and hematuria.  Musculoskeletal: Positive for myalgias. Negative for back pain and neck pain.  Feet pain  Skin: Positive for rash.  Neurological: Positive for weakness. Negative for dizziness, tingling, tremors, sensory change, speech change, focal weakness and headaches.       Neuropathy of both feet  Endo/Heme/Allergies: Bruises/bleeds easily.  Psychiatric/Behavioral: Negative for depression.    MEDICATIONS AT HOME:   Prior to Admission medications   Medication Sig Start Date End Date Taking? Authorizing Provider  aspirin EC 81 MG tablet Take 81 mg by mouth daily.   Yes Historical Provider, MD  Cholecalciferol (VITAMIN D-1000 MAX ST) 1000 UNITS tablet Take 1,000 Units by mouth daily.   Yes Historical  Provider, MD  cyanocobalamin (,VITAMIN B-12,) 1000 MCG/ML injection Inject 1,000 mcg into the muscle every 30 (thirty) days. 02/19/15  Yes Historical Provider, MD  gabapentin (NEURONTIN) 400 MG capsule Take 800 mg by mouth 3 (three) times daily.   Yes Historical Provider, MD  insulin glargine (LANTUS) 100 UNIT/ML injection Inject 30 Units into the skin at bedtime.   Yes Historical Provider, MD  insulin lispro (HUMALOG) 100 UNIT/ML injection Inject 10 Units into the skin 3 (three) times daily before meals. And sliding scale depending on blood sugar   Yes Historical Provider, MD  lisinopril (PRINIVIL,ZESTRIL) 2.5 MG tablet Take 2.5 mg by mouth daily. 01/31/15  Yes Historical Provider, MD  meloxicam (MOBIC) 7.5 MG tablet Take 7.5 mg by mouth daily. 01/31/15  Yes Historical Provider, MD      VITAL SIGNS:  Blood pressure 112/80, pulse 80, temperature 97.8 F (36.6 C), temperature source Oral, resp. rate 18, height 6' (1.829 m), weight 110.678 kg (244 lb), SpO2 100 %.  PHYSICAL EXAMINATION:   Physical Exam  GENERAL:  81 y.o.-year-old obese patient lying in the bed with no acute distress.  EYES: Pupils equal, round, reactive to light and accommodation. No scleral icterus. Extraocular muscles intact. Wears glasses. HEENT: Head atraumatic, normocephalic. Oropharynx and nasopharynx clear.  NECK:  Supple, no jugular venous distention. No thyroid enlargement, no tenderness.  LUNGS: Normal breath sounds bilaterally, no wheezing, rales,rhonchi or crepitation. No use of accessory muscles of respiration.  CARDIOVASCULAR: S1, S2 normal. No murmurs, rubs, or gallops.  ABDOMEN: Soft, nontender, nondistended. Bowel sounds present. No organomegaly or mass.  EXTREMITIES: 1+ pedal edema of both feet noted. Right foot s/p amputation of big toe, huge bloody blister noted at the stump site and some open blisters with serosanguinous discharge from the plantar surface of right foot noted. Left foot plantar surface with  swelling, mottling of skin towards the lateral 3 toes, some open spots with serous discharge. Patient does not have any feeling in the feet from his neuropathy. No cyanosis, or clubbing.  NEUROLOGIC: Cranial nerves II through XII are intact. Muscle strength 5/5 in all extremities. Sensation intact except both feet lacks sensation due to his neuropathy. Gait not checked.  PSYCHIATRIC: The patient is alert and oriented x 3.  SKIN: Skin swelling of the plantar surface of both feet with open wounds and discharge noted. Minimal macular rash noted on the dorsal surface of both feet towards the medial sides.Marland Kitchen   LABORATORY PANEL:   CBC  Recent Labs Lab 02/20/15 1402  WBC 6.3  HGB 11.3*  HCT 33.3*  PLT 110*   ------------------------------------------------------------------------------------------------------------------  Chemistries   Recent Labs Lab 02/20/15 1402  NA 140  K 4.2  CL 108  CO2 27  GLUCOSE 144*  BUN 24*  CREATININE 1.39*  CALCIUM 9.0  AST 21  ALT 12*  ALKPHOS 72  BILITOT 0.5   ------------------------------------------------------------------------------------------------------------------  Cardiac Enzymes No results for input(s): TROPONINI in the last 168 hours. ------------------------------------------------------------------------------------------------------------------  RADIOLOGY:  Dg Foot 2 Views Right  02/20/2015  CLINICAL DATA:  Foot redness/swelling with weeping, status post 1st digit amputation EXAM: RIGHT FOOT - 2 VIEW COMPARISON:  None. FINDINGS: Status post 1st digit amputation. Soft tissue swelling/edema at the surgical margin. No underlying cortical irregularity to suggest acute osteomyelitis. Fracture involving the distal aspect of the second proximal phalanx, likely chronic. The joint spaces are preserved. No radiopaque foreign body is seen. IMPRESSION: Status post 1st digit amputation. Associated soft tissue swelling/edema at the surgical  margin, correlate for cellulitis. No underlying cortical irregularity to suggest acute osteomyelitis. Fracture involving the distal aspect of the second proximal phalanx, likely chronic. No radiopaque foreign body is seen. Electronically Signed   By: Julian Hy M.D.   On: 02/20/2015 15:23    EKG:   Orders placed or performed in visit on 06/22/13  . EKG 12-Lead    IMPRESSION AND PLAN:   Matthew Mcguire  is a 81 y.o. male with a known history of  type 1 diabetes mellitus on insulin, CAD, neuropathy and retinopathy, CKD  comes with sudden onset of bleeding blisters on both his feet.  #1 Bilateral Feet ulcers and bloody blisters-no trauma noted. -X-ray with no evidence of osteomyelitis, huge bloody blisters and possible cellulitis noted. -blood cultures and wound cultures ordered. - podiatry consult. -Empirically on vancomycin and Zosyn for now -Wound care consult for wound dressing changes  #2 Type 1 diabetes mellitus- follows with Dr. Elisabeth Cara. Last A1c 7.4. -Continue his Lantus 30 units at bedtime and also pre-meal insulin. -Sliding scale insulin as well. -Lisinopril for renal protection  #3 coronary artery disease-stable at this time. Discontinue his aspirin due to potential bleeding blisters.  #4 pernicious anemia-on B12 supplements  #5 DVT prophylaxis-avoid anticoagulants at this time. Teds and SCDs   All the records are reviewed and case discussed with ED provider. Management plans discussed with the patient, family and they are in agreement.  CODE STATUS: Full Code  TOTAL TIME TAKING CARE OF THIS PATIENT: 50 minutes.    Gladstone Lighter M.D on 02/20/2015 at 4:14 PM  Between 7am to 6pm - Pager - [phone]  After 6pm go to [email or web address] - password [email or web address] [facility]  Office  [phone]  CC: Primary care physician; Idelle Crouch, MD

## 2015-02-20 NOTE — ED Provider Notes (Signed)
Time Seen: Approximately 1324 I have reviewed the triage notes  Chief Complaint: Foot Pain   History of Present Illness: Matthew Mcguire is a 81 y.o. male who presents with a clear drainage at the base of his right foot without any significant pain. Patient has significant history for diabetic neuropathy. He states it was noted by family members to have a dark appearance to the edge of his foot and states that all this started since midnight. He denies any fever at home or any increased blood sugars. He states the drainage and the swelling seemed to continue throughout the morning and came to the emergency department at that time. He states his amputation of his toe was secondary to cancer and was not from an infectious cause. He does have a long history of insulin-dependent diabetes. He denies any chest pain or shortness of breath, productive cough or wheezing, increased urination or burning with urination.  Past Medical History  Diagnosis Date  . Diabetes ([place])   . CAD (coronary artery disease)     There are no active problems to display for this patient.   Past Surgical History  Procedure Laterality Date  . Coronary artery bypass graft    . Foot surgery    . Toe amputation  2012    great toe on left foot     Past Surgical History  Procedure Laterality Date  . Coronary artery bypass graft    . Foot surgery    . Toe amputation  2012    great toe on left foot     Current Outpatient Rx  Name  Route  Sig  Dispense  Refill  . aspirin EC 81 MG tablet   Oral   Take 81 mg by mouth daily.         . Cholecalciferol (VITAMIN D-1000 MAX ST) 1000 UNITS tablet   Oral   Take 1,000 Units by mouth daily.         . cyanocobalamin (,VITAMIN B-12,) 1000 MCG/ML injection   Intramuscular   Inject 1,000 mcg into the muscle every 30 (thirty) days.         Marland Kitchen gabapentin (NEURONTIN) 400 MG capsule   Oral   Take 800 mg by mouth 3 (three) times daily.         . insulin glargine  (LANTUS) 100 UNIT/ML injection   Subcutaneous   Inject 30 Units into the skin at bedtime.         . insulin lispro (HUMALOG) 100 UNIT/ML injection   Subcutaneous   Inject 10 Units into the skin 3 (three) times daily before meals. And sliding scale depending on blood sugar         . lisinopril (PRINIVIL,ZESTRIL) 2.5 MG tablet   Oral   Take 2.5 mg by mouth daily.         . meloxicam (MOBIC) 7.5 MG tablet   Oral   Take 7.5 mg by mouth daily.           Allergies:  Review of patient's allergies indicates no known allergies.  Family History: History reviewed. No pertinent family history.  Social History: Social History  Substance Use Topics  . Smoking status: Former Research scientist (life sciences)  . Smokeless tobacco: Current User    Types: Chew  . Alcohol Use: No     Review of Systems:   10 point review of systems was performed and was otherwise negative:  Constitutional: No fever Eyes: No visual disturbances ENT: No sore throat, ear  pain Cardiac: No chest pain Respiratory: No shortness of breath, wheezing, or stridor Abdomen: No abdominal pain, no vomiting, No diarrhea Endocrine: No weight loss, No night sweats Extremities: No peripheral edema, cyanosis Skin: No rashes, easy bruising Neurologic: No focal weakness, trouble with speech or swollowing Urologic: No dysuria, Hematuria, or urinary frequency   Physical Exam:  ED Triage Vitals  Enc Vitals Group     BP 02/20/15 1301 112/72 mmHg     Pulse Rate 02/20/15 1301 88     Resp 02/20/15 1301 18     Temp 02/20/15 1301 97.8 F (36.6 C)     Temp Source 02/20/15 1301 Oral     SpO2 02/20/15 1301 98 %     Weight 02/20/15 1301 244 lb (110.678 kg)     Height 02/20/15 1301 6' (1.829 m)     Head Cir --      Peak Flow --      Pain Score --      Pain Loc --      Pain Edu? --      Excl. in [place]? --     General: Awake , Alert , and Oriented times 3; GCS 15 Head: Normal cephalic , atraumatic Eyes: Pupils equal , round, reactive to  light Nose/Throat: No nasal drainage, patent upper airway without erythema or exudate.  Neck: Supple, Full range of motion, No anterior adenopathy or palpable thyroid masses Lungs: Clear to ascultation without wheezes , rhonchi, or rales Heart: Regular rate, regular rhythm without murmurs , gallops , or rubs Abdomen: Soft, non tender without rebound, guarding , or rigidity; bowel sounds positive and symmetric in all 4 quadrants. No organomegaly .        Extremities: examination of the right lower extremity shows a blister over the area of a previously amputated site that appears as a blood blister with significant edema, surrounding erythema, and splitting of the skin on the base of the second digit. No obvious foreign body is noted a blister extends on the posterior surface of the foot from the ball of the foot toward the lateral area. Ecchymosis is noted throughout this area. Examination of the left foot again on the base shows what appears to be an early developing blister.  Neurologic: normal ambulation, Motor symmetric without deficits, sensory intact Skin: warm, dry, no rashes   Labs:   All laboratory work was reviewed including any pertinent negatives or positives listed below:  Labs Reviewed  CBC WITH DIFFERENTIAL/PLATELET - Abnormal; Notable for the following:    RBC 3.50 (*)    Hemoglobin 11.3 (*)    HCT 33.3 (*)    Platelets 110 (*)    All other components within normal limits  COMPREHENSIVE METABOLIC PANEL - Abnormal; Notable for the following:    Glucose, Bld 144 (*)    BUN 24 (*)    Creatinine, Ser 1.39 (*)    ALT 12 (*)    GFR calc non Af Amer 45 (*)    GFR calc Af Amer 52 (*)    All other components within normal limits  CULTURE, BLOOD (ROUTINE X 2)  CULTURE, BLOOD (ROUTINE X 2)  LACTIC ACID, PLASMA  SEDIMENTATION RATE  LACTIC ACID, PLASMA  review of his laboratory work showed no significant findings  radiology  EXAM: RIGHT FOOT - 2 VIEW  COMPARISON:  None.  FINDINGS: Status post 1st digit amputation. Soft tissue swelling/edema at the surgical margin.  No underlying cortical irregularity to suggest acute osteomyelitis.  Fracture  involving the distal aspect of the second proximal phalanx, likely chronic.  The joint spaces are preserved.  No radiopaque foreign body is seen.  IMPRESSION: Status post 1st digit amputation. Associated soft tissue swelling/edema at the surgical margin, correlate for cellulitis. No underlying cortical irregularity to suggest acute osteomyelitis.  Fracture involving the distal aspect of the second proximal phalanx, likely chronic.  No radiopaque foreign body is seen.  I personally reviewed the radiologic studies       ED Course: Patient received a septic workup and was initiated on IV antibiotics after blood cultures 2 were obtained. I made the assumption that the blisters and drainage are secondary to an infectious cause. Patient's otherwise hemodynamically stable and afebrile. Patient is a [facility] wishes to stay here at [facility] for further care.   Assessment: Acute onset no blood blisters and drainage on the right foot of a diabetic Possible infectious cause      Plan:  Inpatient management I reviewed the case with the hospitalist team, further disposition and management depends upon their evaluation            Daymon Larsen, MD 02/20/15 1611

## 2015-02-20 NOTE — ED Notes (Signed)
Wife states patient recently taken off his fluid pill on Tuesday.

## 2015-02-20 NOTE — Progress Notes (Signed)
ANTIBIOTIC CONSULT NOTE - INITIAL  Pharmacy Consult for piperacillin/tazobactam and vancomycin Indication: Wound infection/diabetic foot wound  No Known Allergies  Patient Measurements: Height: 6' (182.9 cm) Weight: 244 lb (110.678 kg) IBW/kg (Calculated) : 77.6 Adjusted Body Weight: 74.3 kg  Vital Signs: Temp: 97.8 F (36.6 C) (10/23 2026) Temp Source: Oral (10/23 2026) BP: 135/67 mmHg (10/23 2026) Pulse Rate: 79 (10/23 2026) Intake/Output from previous day:   Intake/Output from this shift:    Labs:  Recent Labs  02/20/15 1402  WBC 6.3  HGB 11.3*  PLT 110*  CREATININE 1.39*   Estimated Creatinine Clearance: 50.8 mL/min (by C-G formula based on Cr of 1.39). No results for input(s): VANCOTROUGH, VANCOPEAK, VANCORANDOM, GENTTROUGH, GENTPEAK, GENTRANDOM, TOBRATROUGH, TOBRAPEAK, TOBRARND, AMIKACINPEAK, AMIKACINTROU, AMIKACIN in the last 72 hours.   Microbiology: No results found for this or any previous visit (from the past 720 hour(s)).  Medical History: Past Medical History  Diagnosis Date  . Diabetes ([place])     type 1 DM  . CAD (coronary artery disease)     s/p CABG  . Diabetic neuropathy ([place])   . Diabetic retinopathy ([place])   . CKD (chronic kidney disease) stage 3, GFR 30-59 ml/min   . Malignant melanoma ([place])   . Pernicious anemia     Medications:  Anti-infectives    Start     Dose/Rate Route Frequency Ordered Stop   02/20/15 2200  piperacillin-tazobactam (ZOSYN) IVPB 3.375 g     3.375 g 12.5 mL/hr over 240 Minutes Intravenous 3 times per day 02/20/15 2024     02/20/15 2100  vancomycin (VANCOCIN) IVPB 1000 mg/200 mL premix     1,000 mg 200 mL/hr over 60 Minutes Intravenous Every 18 hours 02/20/15 2024     02/20/15 1500  vancomycin (VANCOCIN) IVPB 1000 mg/200 mL premix     1,000 mg 200 mL/hr over 60 Minutes Intravenous  Once 02/20/15 1451 02/20/15 1631   02/20/15 1400  vancomycin (VANCOCIN) injection 1 g  Status:  Discontinued     1 g Intravenous   Once 02/20/15 1350 02/20/15 1450   02/20/15 1400  piperacillin-tazobactam (ZOSYN) IVPB 3.375 g     3.375 g 12.5 mL/hr over 240 Minutes Intravenous  Once 02/20/15 1350 02/20/15 1511     Assessment: Pharmacy consulted to dose vancomycin and piperacillin/tazobactam in this 81 year old male presenting with a diabetic foot wound which is bleeding and draining.   Dosing off of adjusted body weight of 74.3 kg since patient is morbidly obese.  Adjusted CrCl ~ 42 mL/min  Kinetics: Ke=0.039  Half-life: 17.7 hours VD=52L Cmin (calculated) ~19  Goal of Therapy:  Vancomycin trough level 15-20 mcg/ml  Plan:  Follow up culture results  Patient received 1 g of vancomycin in the ED Will start vancomycin 1g IV q18h (stacked dose to start 6 hours after initial dose) Vancomycin trough ordered for 10/25 at 0800, which will be at steady state. Anticipate that patient may accumulate vancomycin due to obesity.  Piperacillin/tazobactam 3.375g IV q8h EI  Pharmacy will continue to follow renal function and vancomycin levels and make adjustments as needed. Thank you for the consult!   Darylene Price Jonica Bickhart 02/20/2015,8:28 PM

## 2015-02-20 NOTE — Progress Notes (Signed)
Pt received to unit room 146, has large blood blister on rt great toe stump to 3 toe and bottom of foot which is weeping, also lt foot has blood blister on ball of foot no drainage  Pt oriented to room , and diet given

## 2015-02-21 LAB — CBC
HEMATOCRIT: 33 % — AB (ref 40.0–52.0)
Hemoglobin: 11.2 g/dL — ABNORMAL LOW (ref 13.0–18.0)
MCH: 31.7 pg (ref 26.0–34.0)
MCHC: 33.9 g/dL (ref 32.0–36.0)
MCV: 93.8 fL (ref 80.0–100.0)
PLATELETS: 105 10*3/uL — AB (ref 150–440)
RBC: 3.52 MIL/uL — AB (ref 4.40–5.90)
RDW: 13.5 % (ref 11.5–14.5)
WBC: 4.4 10*3/uL (ref 3.8–10.6)

## 2015-02-21 LAB — GLUCOSE, CAPILLARY
GLUCOSE-CAPILLARY: 89 mg/dL (ref 65–99)
GLUCOSE-CAPILLARY: 211 mg/dL — AB (ref 65–99)
Glucose-Capillary: 88 mg/dL (ref 65–99)
Glucose-Capillary: 200 mg/dL — ABNORMAL HIGH (ref 65–99)

## 2015-02-21 LAB — BASIC METABOLIC PANEL
Anion gap: 5 (ref 5–15)
BUN: 21 mg/dL — ABNORMAL HIGH (ref 6–20)
CHLORIDE: 107 mmol/L (ref 101–111)
CO2: 30 mmol/L (ref 22–32)
CREATININE: 1.32 mg/dL — AB (ref 0.61–1.24)
Calcium: 8.7 mg/dL — ABNORMAL LOW (ref 8.9–10.3)
GFR, EST AFRICAN AMERICAN: 55 mL/min — AB (ref >60)
GFR, EST NON AFRICAN AMERICAN: 48 mL/min — AB (ref >60)
Glucose, Bld: 134 mg/dL — ABNORMAL HIGH (ref 65–99)
POTASSIUM: 4.1 mmol/L (ref 3.5–5.1)
SODIUM: 142 mmol/L (ref 135–145)

## 2015-02-21 NOTE — Clinical Social Work Placement (Signed)
   CLINICAL SOCIAL WORK PLACEMENT  NOTE  Date:  02/21/2015  Patient Details  Name: Matthew Mcguire MRN: [id number] Date of Birth: 1929-04-29  Clinical Social Work is seeking post-discharge placement for this patient at the [place] level of care (*CSW will initial, date and re-position this form in  chart as items are completed):  Yes   Patient/family provided with [place] Work Department's list of facilities offering this level of care within the geographic area requested by the patient (or if unable, by the patient's family).  Yes   Patient/family informed of their freedom to choose among providers that offer the needed level of care, that participate in Medicare, Medicaid or managed care program needed by the patient, have an available bed and are willing to accept the patient.  Yes   Patient/family informed of 's ownership interest in [facility] and [facility], as well as of the fact that they are under no obligation to receive care at these facilities.  PASRR submitted to EDS on       PASRR number received on       Existing PASRR number confirmed on 02/21/15     FL2 transmitted to all facilities in geographic area requested by pt/family on 02/21/15     FL2 transmitted to all facilities within larger geographic area on       Patient informed that his/her managed care company has contracts with or will negotiate with certain facilities, including the following:            Patient/family informed of bed offers received.  Patient chooses bed at       Physician recommends and patient chooses bed at      Patient to be transferred to   on  .  Patient to be transferred to facility by       Patient family notified on   of transfer.  Name of family member notified:        PHYSICIAN Please sign FL2     Additional Comment:    _______________________________________________ Loralyn Freshwater, LCSW 02/21/2015, 3:02  PM

## 2015-02-21 NOTE — Progress Notes (Signed)
PT Cancellation Note  Patient Details Name: Matthew Mcguire MRN: [id number] DOB: April 01, 1929   Cancelled Treatment:    Reason Eval/Treat Not Completed: Other (comment) (See PT note for further details) PT consult attempted this morning, however nursing currently in the process of ordering appropriate shoes for ambulation. Will attempt consult at later time/date when appropriate foot wear is available.    Janyth Contes 02/21/2015, 10:38 AM  Janyth Contes, SPT. [phone]

## 2015-02-21 NOTE — Evaluation (Signed)
Physical Therapy Evaluation Patient Details Name: Matthew Mcguire MRN: [id number] DOB: 1929/01/22 Today's Date: 02/21/2015   History of Present Illness  Pt is an 81 y.o. male presenting to hospital with sudden onset bleeding blisters B feet (per wound care note: Large hemorrhagic blister formation is noted on the entire right distal forefoot and remaining toes. Upon debridement of all the blisters there is no deep extension through the superficial tissues and it appears to be mostly just blister formation. No signs of purulence. Also a large blister formation along the left forefoot and toes involving all of the digits. Again no signs of purulence and no deep extension through the superficial tissues).  Pt s/p debridement 02/20/15.  R foot imaging showing fx involving distal aspect 2nd proximal phalanx (likely chronic).  PMH includes:  R great toe amp secondary to malignant melanoma, CKD, retinopathy, neuropathy, CAD, DM.  Clinical Impression  Currently pt demonstrates impairments with pain, balance, and limitations with functional mobility.  Prior to admission, pt was independent without AD (pt did well within home but was limited in distance in community).  Pt lives with his wife in 2 level home (flight of stairs down to shower in basement) with 2 steps to enter home with B railing.  Currently pt is mod to max assist to stand with RW and min assist x2 to ambulate 30 feet with RW (pt wearing B ortho wedge shoes with heel WB'ing B LE's).  Pt c/o pain in B posterior calves with standing and ambulation (appeared to be from stretch and relieved with once laying in bed).  Pt would benefit from skilled PT to address above noted impairments and functional limitations.  Recommend pt discharge to STR when medically appropriate.     Follow Up Recommendations SNF    Equipment Recommendations       Recommendations for Other Services       Precautions / Restrictions Precautions Precautions: Fall Required  Braces or Orthoses: Other Brace/Splint Other Brace/Splint: B ortho wedge post-op shoes Restrictions Weight Bearing Restrictions: Yes RLE Weight Bearing:  (Heel WB'ing in ortho wedge post-op shoe) LLE Weight Bearing:  (Heel WB'ing in ortho wedge post-op shoe)      Mobility  Bed Mobility Overal bed mobility: Needs Assistance Bed Mobility: Supine to Sit;Sit to Supine     Supine to sit: Supervision;HOB elevated Sit to supine: Supervision;HOB elevated   General bed mobility comments: increased time to perform supine to sit  Transfers Overall transfer level: Needs assistance Equipment used: Rolling walker (2 wheeled) Transfers: Sit to/from Stand Sit to Stand: Mod assist;Max assist;+1-2 safety/equipment (x4 trials)         General transfer comment: pt required vc's for hand and feet placement and transfer technique with RW  Ambulation/Gait Ambulation/Gait assistance: Min assist;+2 physical assistance Ambulation Distance (Feet): 30 Feet Assistive device: Rolling walker (2 wheeled) Gait Pattern/deviations: Step-to pattern;Decreased step length - right;Decreased step length - left Gait velocity: decreased   General Gait Details: vc's to take smaller steps in order to keep heel [place] B; vc's required for RW positioning (not to advance as far each step); pt requiring intermittent vc's for upright posture as well; occasional vc's for heel WB'ing also required  Stairs            Wheelchair Mobility    Modified Rankin (Stroke Patients Only)       Balance Overall balance assessment: Needs assistance Sitting-balance support: No upper extremity supported;Feet supported Sitting balance-Leahy Scale: Good     Standing  balance support: Bilateral upper extremity supported (on RW) Standing balance-Leahy Scale: Fair                               Pertinent Vitals/Pain Pain Assessment: 0-10 Pain Score: 5  Pain Location: posterior leg/calf Pain Descriptors /  Indicators:  (sore with standing and walking (painful stretch)) Pain Intervention(s): Limited activity within patient's tolerance;Monitored during session;Repositioned  Vitals stable and WFL throughout treatment session.    Home Living Family/patient expects to be discharged to:: Private residence Living Arrangements: Spouse/significant other Available Help at Discharge: Family Type of Home: House Home Access: Stairs to enter Entrance Stairs-Rails: Psychiatric nurse of Steps: 2 Home Layout: Two level (Basement walk-in shower) Home Equipment:  (pt and pt's wife report they think they have RW or have access to one)      Prior Function Level of Independence: Independent         Comments: Pt reports being able to ambulate around home but limited distance in community d/t back issues (B knees give out)     Hand Dominance        Extremity/Trunk Assessment   Upper Extremity Assessment: Overall WFL for tasks assessed           Lower Extremity Assessment: Overall WFL for tasks assessed         Communication   Communication: No difficulties  Cognition Arousal/Alertness: Awake/alert Behavior During Therapy: WFL for tasks assessed/performed Overall Cognitive Status: Within Functional Limits for tasks assessed                      General Comments General comments (skin integrity, edema, etc.): bandages intact B feet  Nursing cleared pt for participation in physical therapy.  Pt agreeable to PT session. Per [place] (SPT) who spoke with MD Cleda Mccreedy, pt cleared to ambulate heel [place] with post-op shoe. Nursing obtained B ortho wedge post op shoes.    Exercises   Performed semi-supine B LE therapeutic exercise x 10 reps:  Ankle pumps (AROM B LE's); quad sets x3 second holds (AROM B LE's); glute squeezes x3 second holds (AROM B); SAQ's (AROM R; AROM L); heelslides (AROM R; AROM L), hip abd/adduction (AROM R; AROM L).  Pt required vc's and tactile  cues for correct technique with exercises.       Assessment/Plan    PT Assessment Patient needs continued PT services  PT Diagnosis Difficulty walking;Acute pain   PT Problem List Decreased activity tolerance;Decreased balance;Decreased mobility;Impaired sensation;Decreased knowledge of precautions;Decreased knowledge of use of DME  PT Treatment Interventions DME instruction;Gait training;Stair training;Functional mobility training;Therapeutic activities;Therapeutic exercise;Balance training;Patient/family education;Manual techniques   PT Goals (Current goals can be found in the Care Plan section) Acute Rehab PT Goals Patient Stated Goal: to be able to walk again PT Goal Formulation: With patient Time For Goal Achievement: 03/14/15 Potential to Achieve Goals: Good    Frequency Min 2X/week   Barriers to discharge Decreased caregiver support      Co-evaluation               End of Session Equipment Utilized During Treatment: Gait belt Activity Tolerance: Patient limited by fatigue;Patient limited by pain Patient left: in bed;with call bell/phone within reach;with nursing/sitter in room (bed alarm not working; nursing notified.  B heels elevated on pillows) Nurse Communication: Mobility status;Precautions;Weight bearing status         Time: 1330-1405 PT Time Calculation (min) (  ACUTE ONLY): 35 min   Charges:   PT Evaluation $Initial PT Evaluation Tier I: 1 Procedure PT Treatments $Therapeutic Exercise: 8-22 mins   PT G CodesLeitha Bleak 2015-03-04, 2:59 PM Leitha Bleak, [place]

## 2015-02-21 NOTE — Progress Notes (Signed)
Matthew Mcguire is a 81 y.o. male  <principal problem not specified>   SUBJECTIVE:  Pt admitted with LE blisters and cellulitis. On IV ABX. Has been seen by Podiatry. Afebrile in NAD.  ______________________________________________________________________  ROS: Review of systems is unremarkable for any active cardiac,respiratory, GI, GU, hematologic, neurologic or psychiatric systems, 10 systems reviewed.  @CMEDLIST @  Past Medical History  Diagnosis Date  . Diabetes ([place])     type 1 DM  . CAD (coronary artery disease)     s/p CABG  . Diabetic neuropathy ([place])   . Diabetic retinopathy ([place])   . CKD (chronic kidney disease) stage 3, GFR 30-59 ml/min   . Malignant melanoma ([place])   . Pernicious anemia     Past Surgical History  Procedure Laterality Date  . Coronary artery bypass graft    . Foot surgery    . Toe amputation  2012    great toe on right foot due to melanoma  . Dupuytrens contracture      release on left little finger  . Appendectomy    . Tonsillectomy    . Cataract repair      bilateral  . Fasiectomy      for dupuytrens contracture of right hand    PHYSICAL EXAM:  BP 105/44 mmHg  Pulse 70  Temp(Src) 98.1 F (36.7 C) (Oral)  Resp 17  Ht 6' (1.829 m)  Wt 110.678 kg (244 lb)  BMI 33.09 kg/m2  SpO2 98%  Wt Readings from Last 3 Encounters:  02/20/15 110.678 kg (244 lb)            Constitutional: NAD Neck: supple, no thyromegaly Respiratory: CTA, no rales or wheezes Cardiovascular: RRR, no murmur, no gallop Abdomen: soft, good BS, nontender Extremities: trace edema Neuro: alert and oriented, no focal motor or sensory deficits  ASSESSMENT/PLAN:  Labs and imaging studies were reviewed  No change in care. Continue IV ABX. Cultures pending. Await f/u from Podiatry and the Wound Care team. Repeat labs in AM. PT and CM consults for D/C planning.

## 2015-02-21 NOTE — Clinical Social Work Note (Signed)
Clinical Social Work Assessment  Patient Details  Name: Matthew Mcguire MRN: [id number] Date of Birth: [birth year]  Date of referral:  02/21/15               Reason for consult:  Facility Placement                Permission sought to share information with:  Chartered certified accountant granted to share information::  Yes, Verbal Permission Granted  Name::      [place]::   [place]   Relationship::     Contact Information:     Housing/Transportation Living arrangements for the past 2 months:  [place] of Information:  Patient, Spouse Patient Interpreter Needed:  None Criminal Activity/Legal Involvement Pertinent to Current Situation/Hospitalization:  No - Comment as needed Significant Relationships:  Spouse, Friend Lives with:  Spouse Do you feel safe going back to the place where you live?  Yes Need for family participation in patient care:  Yes (Comment)  Care giving concerns: Patient lives with his wife Matthew Mcguire in [place].    Social Worker assessment / plan: Holiday representative (CSW) received verbal consult from PT that patient needs short term rehab. CSW met with patient and his wife Matthew Mcguire [phone] and 2 friends were at bedside. CSW introduced self and explained role of CSW department. Patient was alert and oriented and laying in the bed. Patient reported that he lives in [place] with his wife. CSW explained that PT is recommending rehab. Patient is agreeable to SNF search and prefers [facility]. CSW explained that patient will need a 3 night inpatient qualifying stay in order for Medicare to pay for rehab. Patient and wife verbalized their understanding.   FL2 complete and on chart.   Employment status:  Retired Forensic scientist:  Medicare PT Recommendations:  [place] / Referral to community resources:  [place]  Patient/Family's Response to care:  Patient and wife are agreeable to [facility].   Patient/Family's Understanding of and Emotional Response to Diagnosis, Current Treatment, and Prognosis: Patient and wife were pleasant and thanked CSW for visit.   Emotional Assessment Appearance:  Appears stated age Attitude/Demeanor/Rapport:    Affect (typically observed):  Accepting, Adaptable, Pleasant Orientation:  Oriented to Self, Oriented to Place, Oriented to  Time, Oriented to Situation Alcohol / Substance use:  Not Applicable Psych involvement (Current and /or in the community):  No (Comment)  Discharge Needs  Concerns to be addressed:  Discharge Planning Concerns Readmission within the last 30 days:  No Current discharge risk:  Dependent with Mobility Barriers to Discharge:  Continued Medical Work up   Loralyn Freshwater, LCSW 02/21/2015, 3:04 PM

## 2015-02-21 NOTE — Consult Note (Signed)
WOC wound follow up Wound type: Surgical debridement blisters to right pedal surface of foot and left forefoot and toes.    Wound bed:The skin is warm dry and somewhat atrophic. Some bilateral edema is noted. Large hemorrhagic blister formation is noted on the entire right distal forefoot and remaining toes. Upon debridement of all the blisters there is no deep extension through the superficial tissues and it appears to be mostly just blister formation. No signs of purulence. Also a large blister formation along the left forefoot and toes involving all of the digits. Again no signs of purulence and no deep extension through the superficial tissues  Dressing procedure/placement/frequency: Silvadene cream to bilateral feet, per surgical orders.  [place] team will not follow at this time.  Will not follow at this time.  Please re-consult if needed.  Domenic Moras RN BSN [place] Pager [phone]

## 2015-02-21 NOTE — Progress Notes (Signed)
ANTIBIOTIC CONSULT NOTE - INITIAL  Pharmacy Consult for piperacillin/tazobactam and vancomycin Indication: Wound infection/diabetic foot wound  No Known Allergies  Patient Measurements: Height: 6' (182.9 cm) Weight: 244 lb (110.678 kg) IBW/kg (Calculated) : 77.6 Adjusted Body Weight: 74.3 kg  Vital Signs: Temp: 98.1 F (36.7 C) (10/24 0735) Temp Source: Oral (10/24 0735) BP: 105/48 mmHg (10/24 0735) Pulse Rate: 74 (10/24 0735) Intake/Output from previous day: 10/23 0701 - 10/24 0700 In: 490 [P.O.:240; IV Piggyback:250] Out: 725 [Urine:725] Intake/Output from this shift: Total I/O In: 240 [P.O.:240] Out: -   Labs:  Recent Labs  02/20/15 1402 02/21/15 0330  WBC 6.3 4.4  HGB 11.3* 11.2*  PLT 110* 105*  CREATININE 1.39* 1.32*   Estimated Creatinine Clearance: 53.5 mL/min (by C-G formula based on Cr of 1.32). No results for input(s): VANCOTROUGH, VANCOPEAK, VANCORANDOM, GENTTROUGH, GENTPEAK, GENTRANDOM, TOBRATROUGH, TOBRAPEAK, TOBRARND, AMIKACINPEAK, AMIKACINTROU, AMIKACIN in the last 72 hours.   Microbiology: Recent Results (from the past 720 hour(s))  Wound culture     Status: None (Preliminary result)   Collection Time: 02/20/15  6:00 PM  Result Value Ref Range Status   Specimen Description WOUND  Final   Special Requests Normal  Final   Gram Stain PENDING  Incomplete   Culture TOO YOUNG TO READ  Final   Report Status PENDING  Incomplete    Medical History: Past Medical History  Diagnosis Date  . Diabetes ([place])     type 1 DM  . CAD (coronary artery disease)     s/p CABG  . Diabetic neuropathy ([place])   . Diabetic retinopathy ([place])   . CKD (chronic kidney disease) stage 3, GFR 30-59 ml/min   . Malignant melanoma ([place])   . Pernicious anemia     Medications:  Anti-infectives    Start     Dose/Rate Route Frequency Ordered Stop   02/20/15 2200  piperacillin-tazobactam (ZOSYN) IVPB 3.375 g     3.375 g 12.5 mL/hr over 240 Minutes Intravenous 3 times per  day 02/20/15 2024     02/20/15 2100  vancomycin (VANCOCIN) IVPB 1000 mg/200 mL premix     1,000 mg 200 mL/hr over 60 Minutes Intravenous Every 18 hours 02/20/15 2024     02/20/15 1500  vancomycin (VANCOCIN) IVPB 1000 mg/200 mL premix     1,000 mg 200 mL/hr over 60 Minutes Intravenous  Once 02/20/15 1451 02/20/15 1631   02/20/15 1400  vancomycin (VANCOCIN) injection 1 g  Status:  Discontinued     1 g Intravenous  Once 02/20/15 1350 02/20/15 1450   02/20/15 1400  piperacillin-tazobactam (ZOSYN) IVPB 3.375 g     3.375 g 12.5 mL/hr over 240 Minutes Intravenous  Once 02/20/15 1350 02/20/15 1511     Assessment: Pharmacy consulted to dose vancomycin and piperacillin/tazobactam in this 81 year old male presenting with a diabetic foot wound which is bleeding and draining.   Dosing off of adjusted body weight of 74.3 kg since patient is morbidly obese.  Adjusted CrCl ~ 42 mL/min  Kinetics: Ke=0.039  Half-life: 17.7 hours VD=52L Cmin (calculated) ~19  Goal of Therapy:  Vancomycin trough level 15-20 mcg/ml  Plan:  Follow up culture results  Patient received 1 g of vancomycin in the ED Will start vancomycin 1g IV q18h (stacked dose to start 6 hours after initial dose) Vancomycin trough ordered for 10/25 at 0800, which will be at steady state. Anticipate that patient may accumulate vancomycin due to obesity.  10/24 Patient did not receive 21:00 dose of  Vancomycin on 10/23. Will adjust timing of trough level.  Piperacillin/tazobactam 3.375g IV q8h EI  Pharmacy will continue to follow renal function and vancomycin levels and make adjustments as needed. Thank you for the consult!   Paulina Fusi, PharmD, BCPS 02/21/2015 1:24 PM

## 2015-02-21 NOTE — Care Management Note (Signed)
Case Management Note  Patient Details  Name: Matthew Mcguire MRN: [id number] Date of Birth: 08-Nov-1928  Subjective/Objective:                  Met with patient to discuss discharge planning. He states he has no drug coverage and that "it costs more than the Rx". He states that he is followed by Dr. Gabriel Carina and recently received an Rx that was over $400 (diabetic med). He states he has a walker but not sure where it is. He requests a cane but that may not be sufficient enough. His PCP is Dr. Doy Hutching. He states he checks his blood sugars "4 time per day". He may need dressing assistance in the home.   Action/Plan:  List of home health providers shared with patient. He may need a rolling walker. RNCM to evaluate discharge Rx to see if any coupons are available. MD please consider this when discharge Rx are ordered as this is only a temporary fix- 30 or 90-day Rx may help lower costs to this patient. RNCM will continue to follow.   Expected Discharge Date:                  Expected Discharge Plan:     In-House Referral:     Discharge planning Services  CM Consult, Medication Assistance  Post Acute Care Choice:  Home Health, Durable Medical Equipment Choice offered to:  Patient  DME Arranged:    DME Agency:     HH Arranged:    [facility] Agency:     Status of Service:  In process, will continue to follow  Medicare Important Message Given:    Date Medicare IM Given:    Medicare IM give by:    Date Additional Medicare IM Given:    Additional Medicare Important Message give by:     If discussed at [place] of Stay Meetings, dates discussed:    Additional Comments:  Marshell Garfinkel, RN 02/21/2015, 9:02 AM

## 2015-02-22 LAB — CBC WITH DIFFERENTIAL/PLATELET
BASOS ABS: 0.1 10*3/uL (ref 0–0.1)
EOS ABS: 0.2 10*3/uL (ref 0–0.7)
HCT: 33.9 % — ABNORMAL LOW (ref 40.0–52.0)
Hemoglobin: 11.3 g/dL — ABNORMAL LOW (ref 13.0–18.0)
LYMPHS ABS: 1.4 10*3/uL (ref 1.0–3.6)
Lymphocytes Relative: 25 %
MCH: 31.5 pg (ref 26.0–34.0)
MCHC: 33.4 g/dL (ref 32.0–36.0)
MCV: 94.4 fL (ref 80.0–100.0)
Monocytes Absolute: 0.6 10*3/uL (ref 0.2–1.0)
Monocytes Relative: 12 %
Neutro Abs: 3.3 10*3/uL (ref 1.4–6.5)
Neutrophils Relative %: 59 %
PLATELETS: 96 10*3/uL — AB (ref 150–440)
RBC: 3.59 MIL/uL — AB (ref 4.40–5.90)
RDW: 13.2 % (ref 11.5–14.5)
WBC: 5.5 10*3/uL (ref 3.8–10.6)

## 2015-02-22 LAB — GLUCOSE, CAPILLARY
GLUCOSE-CAPILLARY: 261 mg/dL — AB (ref 65–99)
GLUCOSE-CAPILLARY: 156 mg/dL — AB (ref 65–99)
GLUCOSE-CAPILLARY: 220 mg/dL — AB (ref 65–99)
Glucose-Capillary: 230 mg/dL — ABNORMAL HIGH (ref 65–99)

## 2015-02-22 LAB — BASIC METABOLIC PANEL
Anion gap: 4 — ABNORMAL LOW (ref 5–15)
BUN: 19 mg/dL (ref 6–20)
CO2: 31 mmol/L (ref 22–32)
Calcium: 8.8 mg/dL — ABNORMAL LOW (ref 8.9–10.3)
Chloride: 104 mmol/L (ref 101–111)
Creatinine, Ser: 1.6 mg/dL — ABNORMAL HIGH (ref 0.61–1.24)
GFR calc Af Amer: 44 mL/min — ABNORMAL LOW (ref >60)
GFR, EST NON AFRICAN AMERICAN: 38 mL/min — AB (ref >60)
Glucose, Bld: 256 mg/dL — ABNORMAL HIGH (ref 65–99)
POTASSIUM: 4.5 mmol/L (ref 3.5–5.1)
SODIUM: 139 mmol/L (ref 135–145)

## 2015-02-22 MED ORDER — SODIUM CHLORIDE 0.9 % IV SOLN
INTRAVENOUS | Status: DC
  Administered 2015-02-22 – 2015-02-23 (×3): via INTRAVENOUS

## 2015-02-22 NOTE — Progress Notes (Addendum)
Inpatient Diabetes Program Recommendations  AACE/ADA: New Consensus Statement on Inpatient Glycemic Control (2015)  Target Ranges:  Prepandial:   less than 140 mg/dL      Peak postprandial:   less than 180 mg/dL (1-2 hours)      Critically ill patients:  140 - 180 mg/dL  Results for FENDER, HERDER (MRN [id number]) as of 02/22/2015 10:56  Ref. Range 02/21/2015 07:37 02/21/2015 11:12 02/21/2015 16:00 02/21/2015 21:03 02/22/2015 07:35  Glucose-Capillary Latest Ref Range: 65-99 mg/dL 88 89 200 (H) 211 (H) 230 (H)   Review of Glycemic Control  Diabetes history: DM1 Outpatient Diabetes medications: Lantus 30 units QHS, Humalog 10 units TID with meals plus correction scale Current orders for Inpatient glycemic control: Lantus 30 units QHS, Novolog 0-9 units TID with meals, Novolog 0-5 units HS, Novolog 3 units TID with meals for meal coverage  Inpatient Diabetes Program Recommendations: Insulin - Meal Coverage: Please consider increasing meal coverage to 6 units TID with meals if patient is eating at least 50% of meals.  Thanks, Barnie Alderman, RN, MSN, CCRN, CDE Diabetes Coordinator Inpatient Diabetes Program [phone] (Team Pager from [place] to [place]) [phone] ([place] office) [phone] [facility] office) [phone] [facility] office)

## 2015-02-22 NOTE — Progress Notes (Signed)
ANTIBIOTIC CONSULT NOTE - INITIAL  Pharmacy Consult for piperacillin/tazobactam and vancomycin Indication: Wound infection/diabetic foot wound  No Known Allergies  Patient Measurements: Height: 6' (182.9 cm) Weight: 244 lb (110.678 kg) IBW/kg (Calculated) : 77.6 Adjusted Body Weight: 74.3 kg  Vital Signs: Temp: 98.9 F (37.2 C) (10/25 0737) Temp Source: Oral (10/25 0737) BP: 111/37 mmHg (10/25 0738) Pulse Rate: 72 (10/25 0738) Intake/Output from previous day: 10/24 0701 - 10/25 0700 In: 848 [P.O.:598; IV Piggyback:250] Out: 2025 [Urine:2025] Intake/Output from this shift: Total I/O In: 350 [I.V.:193; IV Piggyback:157] Out: 0   Labs:  Recent Labs  02/20/15 1402 02/21/15 0330 02/22/15 0445  WBC 6.3 4.4 5.5  HGB 11.3* 11.2* 11.3*  PLT 110* 105* 96*  CREATININE 1.39* 1.32* 1.60*   Estimated Creatinine Clearance: 44.1 mL/min (by C-G formula based on Cr of 1.6). No results for input(s): VANCOTROUGH, VANCOPEAK, VANCORANDOM, GENTTROUGH, GENTPEAK, GENTRANDOM, TOBRATROUGH, TOBRAPEAK, TOBRARND, AMIKACINPEAK, AMIKACINTROU, AMIKACIN in the last 72 hours.   Microbiology: Recent Results (from the past 720 hour(s))  Culture, blood (routine x 2)     Status: None (Preliminary result)   Collection Time: 02/20/15  2:02 PM  Result Value Ref Range Status   Specimen Description BLOOD RIGHT AC  Final   Special Requests BOTTLES DRAWN AEROBIC AND ANAEROBIC  1CC  Final   Culture NO GROWTH 2 DAYS  Final   Report Status PENDING  Incomplete  Culture, blood (routine x 2)     Status: None (Preliminary result)   Collection Time: 02/20/15  2:21 PM  Result Value Ref Range Status   Specimen Description BLOOD RIGHT AC  Final   Special Requests BOTTLES DRAWN AEROBIC AND ANAEROBIC  1CC  Final   Culture NO GROWTH 2 DAYS  Final   Report Status PENDING  Incomplete  Culture, blood (routine x 2)     Status: None (Preliminary result)   Collection Time: 02/20/15  5:52 PM  Result Value Ref Range Status    Specimen Description BLOOD LEFT HAND  Final   Special Requests BOTTLES DRAWN AEROBIC AND ANAEROBIC  3CC  Final   Culture NO GROWTH 2 DAYS  Final   Report Status PENDING  Incomplete  Wound culture     Status: None (Preliminary result)   Collection Time: 02/20/15  6:00 PM  Result Value Ref Range Status   Specimen Description WOUND  Final   Special Requests Normal  Final   Gram Stain FEW WBC SEEN NO ORGANISMS SEEN   Final   Culture HOLDING FOR POSSIBLE PATHOGEN  Final   Report Status PENDING  Incomplete  Culture, blood (routine x 2)     Status: None (Preliminary result)   Collection Time: 02/20/15  6:02 PM  Result Value Ref Range Status   Specimen Description BLOOD RIGHT HAND  Final   Special Requests BOTTLES DRAWN AEROBIC AND ANAEROBIC  4CC  Final   Culture NO GROWTH 2 DAYS  Final   Report Status PENDING  Incomplete    Medical History: Past Medical History  Diagnosis Date  . Diabetes ([place])     type 1 DM  . CAD (coronary artery disease)     s/p CABG  . Diabetic neuropathy ([place])   . Diabetic retinopathy ([place])   . CKD (chronic kidney disease) stage 3, GFR 30-59 ml/min   . Malignant melanoma ([place])   . Pernicious anemia     Medications:  Anti-infectives    Start     Dose/Rate Route Frequency Ordered Stop   02/20/15  2200  piperacillin-tazobactam (ZOSYN) IVPB 3.375 g     3.375 g 12.5 mL/hr over 240 Minutes Intravenous 3 times per day 02/20/15 2024     02/20/15 2100  vancomycin (VANCOCIN) IVPB 1000 mg/200 mL premix     1,000 mg 200 mL/hr over 60 Minutes Intravenous Every 18 hours 02/20/15 2024     02/20/15 1500  vancomycin (VANCOCIN) IVPB 1000 mg/200 mL premix     1,000 mg 200 mL/hr over 60 Minutes Intravenous  Once 02/20/15 1451 02/20/15 1631   02/20/15 1400  vancomycin (VANCOCIN) injection 1 g  Status:  Discontinued     1 g Intravenous  Once 02/20/15 1350 02/20/15 1450   02/20/15 1400  piperacillin-tazobactam (ZOSYN) IVPB 3.375 g     3.375 g 12.5 mL/hr over 240 Minutes  Intravenous  Once 02/20/15 1350 02/20/15 1511     Assessment: Pharmacy consulted to dose vancomycin and piperacillin/tazobactam in this 81 year old male presenting with a diabetic foot wound which is bleeding and draining.   Dosing off of adjusted body weight of 74.3 kg since patient is morbidly obese.  Adjusted CrCl ~ 42 mL/min  Kinetics: Ke=0.039  Half-life: 17.7 hours VD=52L Cmin (calculated) ~19  Goal of Therapy:  Vancomycin trough level 15-20 mcg/ml  Plan:  Follow up culture results  Patient received 1 g of vancomycin in the ED Will start vancomycin 1g IV q18h (stacked dose to start 6 hours after initial dose) Vancomycin trough ordered for 10/25 at 0800, which will be at steady state. Anticipate that patient may accumulate vancomycin due to obesity.  10/24 Patient did not receive 21:00 dose of Vancomycin on 10/23. Will adjust timing of trough level. 10/25 Slight bump in SCr, recalculated to make sure dosing appropriate. No changes needed will watch closely.  Piperacillin/tazobactam 3.375g IV q8h EI  Pharmacy will continue to follow renal function and vancomycin levels and make adjustments as needed. Thank you for the consult!   Paulina Fusi, PharmD, BCPS 02/22/2015 10:36 AM

## 2015-02-22 NOTE — Progress Notes (Signed)
Tlaloc Taddei is a 81 y.o. male  <principal problem not specified>   SUBJECTIVE:  Pt in NAD. Eating and sleeping well. Denies pain. Podiatry, PT, and CSW notes appreciated.  ______________________________________________________________________  ROS: Review of systems is unremarkable for any active cardiac,respiratory, GI, GU, hematologic, neurologic or psychiatric systems, 10 systems reviewed.  @CMEDLIST @  Past Medical History  Diagnosis Date  . Diabetes ([place])     type 1 DM  . CAD (coronary artery disease)     s/p CABG  . Diabetic neuropathy ([place])   . Diabetic retinopathy ([place])   . CKD (chronic kidney disease) stage 3, GFR 30-59 ml/min   . Malignant melanoma ([place])   . Pernicious anemia     Past Surgical History  Procedure Laterality Date  . Coronary artery bypass graft    . Foot surgery    . Toe amputation  2012    great toe on right foot due to melanoma  . Dupuytrens contracture      release on left little finger  . Appendectomy    . Tonsillectomy    . Cataract repair      bilateral  . Fasiectomy      for dupuytrens contracture of right hand    PHYSICAL EXAM:  BP 111/46 mmHg  Pulse 74  Temp(Src) 98.9 F (37.2 C) (Oral)  Resp 18  Ht 6' (1.829 m)  Wt 110.678 kg (244 lb)  BMI 33.09 kg/m2  SpO2 98%  Wt Readings from Last 3 Encounters:  02/20/15 110.678 kg (244 lb)            Constitutional: NAD Neck: supple, no thyromegaly Respiratory: CTA, no rales or wheezes Cardiovascular: RRR, no murmur, no gallop Abdomen: soft, good BS, nontender Extremities: 1+ edema Neuro: alert and oriented, no focal motor or sensory deficits  ASSESSMENT/PLAN:  Labs and imaging studies were reviewed  Will continue IV ABX. IV fluids today. Wound care per Podiatry. Plan D/C to SNF tomorrow.

## 2015-02-22 NOTE — Progress Notes (Signed)
Physical Therapy Treatment Patient Details Name: Matthew Mcguire MRN: [id number] DOB: [birth year] Today's Date: 02/22/2015    History of Present Illness Pt is an 81 y.o. male presenting to hospital with sudden onset bleeding blisters B feet (per wound care note: Large hemorrhagic blister formation is noted on the entire right distal forefoot and remaining toes. Upon debridement of all the blisters there is no deep extension through the superficial tissues and it appears to be mostly just blister formation. No signs of purulence. Also a large blister formation along the left forefoot and toes involving all of the digits. Again no signs of purulence and no deep extension through the superficial tissues).  Pt s/p debridement 02/20/15.  R foot imaging showing fx involving distal aspect 2nd proximal phalanx (likely chronic).  PMH includes:  R great toe amp secondary to malignant melanoma, CKD, retinopathy, neuropathy, CAD, DM.    PT Comments    Per verbal discussion with MD Cleda Mccreedy, pt still heel [place] B LE's but pt cleared to wear post-op shoes (with flat bottom) and pt ok to ambulate foot flat with most of pressure on heels and avoid rolling forward onto toes.  Pt and pt's wife educated extensively via verbal and visual demonstration regarding this.  Pt able to ambulate 30 feet with RW with 1 assist but distance limited for safety d/t concerns of pt being able to maintain above precautions (pt appears to fatigue with distance).  Will continue to progress pt per pt tolerance.   Follow Up Recommendations  SNF     Equipment Recommendations       Recommendations for Other Services       Precautions / Restrictions Precautions Precautions: Fall Required Braces or Orthoses: Other Brace/Splint Other Brace/Splint: B post-op shoes Restrictions Weight Bearing Restrictions: Yes RLE Weight Bearing:  (Heel WB'ing in post op shoe) LLE Weight Bearing:  (Heel WB'ing in post op shoe) Other  Position/Activity Restrictions: May do flat foot with most of pressure on heels; do not roll forward onto toes (per verbal discussion with MD Cleda Mccreedy 02/22/15)    Mobility  Bed Mobility Overal bed mobility: Needs Assistance Bed Mobility: Supine to Sit;Sit to Supine     Supine to sit: Mod assist;HOB elevated (assist for trunk) Sit to supine: Supervision;HOB elevated      Transfers Overall transfer level: Needs assistance Equipment used: Rolling walker (2 wheeled) Transfers: Sit to/from Stand Sit to Stand: Min assist         General transfer comment: pt required vc's for hand and feet placement and transfer technique with RW  Ambulation/Gait Ambulation/Gait assistance: Min guard;Min assist Ambulation Distance (Feet): 30 Feet Assistive device: Rolling walker (2 wheeled) Gait Pattern/deviations: Step-to pattern Gait velocity: decreased   General Gait Details: intermittent vc's required to take very small steps in order to keep weight on heels and prevent rolling forward onto toes B   Stairs            Wheelchair Mobility    Modified Rankin (Stroke Patients Only)       Balance Overall balance assessment: Needs assistance Sitting-balance support: No upper extremity supported;Feet supported Sitting balance-Leahy Scale: Good     Standing balance support: Bilateral upper extremity supported (on RW) Standing balance-Leahy Scale: Fair                      Cognition Arousal/Alertness: Awake/alert Behavior During Therapy: WFL for tasks assessed/performed Overall Cognitive Status: Within Functional Limits for tasks assessed  Exercises      General Comments General comments (skin integrity, edema, etc.): bandages intact B feet  Nursing cleared pt for participation in physical therapy.  Pt agreeable to PT session. Pt's wife present during session.      Pertinent Vitals/Pain Pain Assessment: No/denies pain    Home Living                       Prior Function            PT Goals (current goals can now be found in the care plan section) Acute Rehab PT Goals Patient Stated Goal: to be able to walk again PT Goal Formulation: With patient Time For Goal Achievement: 03/14/15 Potential to Achieve Goals: Good Progress towards PT goals: Progressing toward goals    Frequency  Min 2X/week    PT Plan Current plan remains appropriate    Co-evaluation             End of Session Equipment Utilized During Treatment: Gait belt Activity Tolerance: Patient limited by fatigue Patient left: in bed;with call bell/phone within reach;with bed alarm set;with family/visitor present (B heels elevated via pillows)     Time: 1445-1510 PT Time Calculation (min) (ACUTE ONLY): 25 min  Charges:  $Gait Training: 8-22 mins $Therapeutic Activity: 8-22 mins                    G CodesLeitha Bleak 02-24-2015, 4:43 PM Leitha Bleak, [place]

## 2015-02-22 NOTE — Care Management Important Message (Signed)
Important Message  Patient Details  Name: [name] MRN: [id number] Date of Birth: January 13, 1929   Medicare Important Message Given:  Yes-second notification given    Juliann Pulse A [place]mond 02/22/2015, 9:20 AM

## 2015-02-22 NOTE — Progress Notes (Signed)
Subjective: Patient seen. Not really complaining of pain. Has had some drainage through the bandage and onto his socks.  Objective: Vital signs in last 24 hours: Temp:  [98.1 F (36.7 C)-98.9 F (37.2 C)] 98.9 F (37.2 C) (10/25 0737) Pulse Rate:  [72-87] 72 (10/25 0738) Resp:  [16-18] 17 (10/25 0737) BP: (100-143)/(36-59) 110/36 mmHg (10/25 1149) SpO2:  [93 %-99 %] 96 % (10/25 0738) Last BM Date: 02/20/15  Intake/Output from previous day: 10/24 0701 - 10/25 0700 In: 848 [P.O.:598; IV Piggyback:250] Out: 2025 [Urine:2025] Intake/Output this shift: Total I/O In: 838.3 [I.V.:481.3; IV Piggyback:357] Out: 0   Heavy drainage is noted on the bandages from the forefoot on both feet. Upon removal the blistered areas on the forefoot and toes appear granular. No significant sign of infection.  Lab Results:   Recent Labs  02/21/15 0330 02/22/15 0445  WBC 4.4 5.5  HGB 11.2* 11.3*  HCT 33.0* 33.9*  PLT 105* 96*   BMET  Recent Labs  02/21/15 0330 02/22/15 0445  NA 142 139  K 4.1 4.5  CL 107 104  CO2 30 31  GLUCOSE 134* 256*  BUN 21* 19  CREATININE 1.32* 1.60*  CALCIUM 8.7* 8.8*   PT/INR No results for input(s): LABPROT, INR in the last 72 hours. ABG No results for input(s): PHART, HCO3 in the last 72 hours.  Invalid input(s): PCO2, PO2  Studies/Results: Dg Foot 2 Views Right  02/20/2015  CLINICAL DATA:  Foot redness/swelling with weeping, status post 1st digit amputation EXAM: RIGHT FOOT - 2 VIEW COMPARISON:  None. FINDINGS: Status post 1st digit amputation. Soft tissue swelling/edema at the surgical margin. No underlying cortical irregularity to suggest acute osteomyelitis. Fracture involving the distal aspect of the second proximal phalanx, likely chronic. The joint spaces are preserved. No radiopaque foreign body is seen. IMPRESSION: Status post 1st digit amputation. Associated soft tissue swelling/edema at the surgical margin, correlate for cellulitis. No  underlying cortical irregularity to suggest acute osteomyelitis. Fracture involving the distal aspect of the second proximal phalanx, likely chronic. No radiopaque foreign body is seen. Electronically Signed   By: Julian Hy M.D.   On: 02/20/2015 15:23    Anti-infectives: Anti-infectives    Start     Dose/Rate Route Frequency Ordered Stop   02/20/15 2200  piperacillin-tazobactam (ZOSYN) IVPB 3.375 g     3.375 g 12.5 mL/hr over 240 Minutes Intravenous 3 times per day 02/20/15 2024     02/20/15 2100  vancomycin (VANCOCIN) IVPB 1000 mg/200 mL premix     1,000 mg 200 mL/hr over 60 Minutes Intravenous Every 18 hours 02/20/15 2024     02/20/15 1500  vancomycin (VANCOCIN) IVPB 1000 mg/200 mL premix     1,000 mg 200 mL/hr over 60 Minutes Intravenous  Once 02/20/15 1451 02/20/15 1631   02/20/15 1400  vancomycin (VANCOCIN) injection 1 g  Status:  Discontinued     1 g Intravenous  Once 02/20/15 1350 02/20/15 1450   02/20/15 1400  piperacillin-tazobactam (ZOSYN) IVPB 3.375 g     3.375 g 12.5 mL/hr over 240 Minutes Intravenous  Once 02/20/15 1350 02/20/15 1511      Assessment/Plan: s/p * No surgery found * The blistered wounds on both feet were redressed using Silvadene cream and sterile bandages. Continue with the same daily wound care. This is already been ordered. Would recommend continuing Silvadene and daily dressings at skilled nursing. Follow-up outpatient in the office in 1-2 weeks.  LOS: 2 days    Lawren Sexson W.  02/22/2015  

## 2015-02-22 NOTE — Progress Notes (Signed)
Clinical Education officer, museum (CSW) met with patient and his wife at bedside. CSW presented offers. Patient chose [facility]. [facility] admissions coordinator at [facility] is aware of accepted offer. Patient asked if non-emergency EMS would be covered for transport to [facility]. CSW made patient and wife aware that they will have a co-pay for EMS. Patient reported that he would like to try and go by personal vehicle with wife. Plan is for patient to D/C to [facility] tomorrow 02/23/15. CSW will continue to follow and assist as needed.   Blima Rich, [place] [phone]

## 2015-02-23 ENCOUNTER — Encounter
Admission: RE | Admit: 2015-02-23 | Discharge: 2015-02-23 | Disposition: A | Discharge status: Home / Self Care | Payer: Medicare Other | Source: Ambulatory Visit | Attending: Internal Medicine | Admitting: Internal Medicine

## 2015-02-23 LAB — CBC WITH DIFFERENTIAL/PLATELET
Basophils Absolute: 0.1 10*3/uL (ref 0–0.1)
Basophils Relative: 1 %
EOS ABS: 0.2 10*3/uL (ref 0–0.7)
HCT: 31.8 % (ref 39.0–52.0)
Hemoglobin: 11.1 g/dL (ref 13.0–17.0)
LYMPHS ABS: 1.7 10*3/uL (ref 1.0–3.6)
Lymphocytes Relative: 28 %
MCH: 32.9 pg (ref 26.0–34.0)
MCHC: 35.1 g/dL (ref 30.0–36.0)
MCV: 93.8 fL (ref 78.0–100.0)
MONO ABS: 0.8 10*3/uL (ref 0.2–1.0)
Monocytes Relative: 13 %
Neutro Abs: 3.4 10*3/uL (ref 1.4–6.5)
Neutrophils Relative %: 55 %
PLATELETS: 90 10*3/uL (ref 150–400)
RBC: 3.39 MIL/uL (ref 4.22–5.81)
RDW: 13.1 % (ref 11.5–15.5)
WBC: 6.1 10*3/uL (ref 4.0–10.5)

## 2015-02-23 LAB — GLUCOSE, CAPILLARY
GLUCOSE-CAPILLARY: 178 mg/dL — AB (ref 65–99)
Glucose-Capillary: 133 mg/dL — ABNORMAL HIGH (ref 65–99)
Glucose-Capillary: 113 mg/dL — ABNORMAL HIGH (ref 65–99)

## 2015-02-23 LAB — WOUND CULTURE: Special Requests: NORMAL

## 2015-02-23 LAB — BASIC METABOLIC PANEL
Anion gap: 5 (ref 5–15)
BUN: 16 mg/dL (ref 6–20)
CALCIUM: 8.5 mg/dL — AB (ref 8.9–10.3)
CO2: 30 mmol/L (ref 22–32)
CREATININE: 1.59 mg/dL — AB (ref 0.61–1.24)
Chloride: 104 mmol/L (ref 101–111)
GFR calc Af Amer: 44 mL/min — ABNORMAL LOW (ref >60)
GFR, EST NON AFRICAN AMERICAN: 38 mL/min — AB (ref >60)
GLUCOSE: 177 mg/dL — AB (ref 65–99)
POTASSIUM: 4 mmol/L (ref 3.5–5.1)
SODIUM: 139 mmol/L (ref 135–145)

## 2015-02-23 MED ORDER — ONDANSETRON HCL 4 MG PO TABS: 4.0000 mg | ORAL_TABLET | Freq: Four times a day (QID) | ORAL | Status: AC | PRN

## 2015-02-23 MED ORDER — DOXYCYCLINE HYCLATE 100 MG PO TABS: 100.0000 mg | ORAL_TABLET | Freq: Two times a day (BID) | ORAL | Status: AC

## 2015-02-23 MED ORDER — ACETAMINOPHEN 325 MG PO TABS: 650.0000 mg | ORAL_TABLET | Freq: Four times a day (QID) | ORAL | Status: AC | PRN

## 2015-02-23 MED ORDER — DOXYCYCLINE HYCLATE 100 MG PO TABS
100.0000 mg | ORAL_TABLET | Freq: Two times a day (BID) | ORAL | Status: DC
  Administered 2015-02-23: 100 mg via ORAL
  Filled 2015-02-23: qty 1

## 2015-02-23 MED ORDER — SILVER SULFADIAZINE 1 % EX CREA: TOPICAL_CREAM | Freq: Every day | CUTANEOUS | Status: AC

## 2015-02-23 NOTE — Progress Notes (Signed)
Called [facility], spoke with Jonnie Finner, RN gave report.  Advised patient would be transporting to [place] with wifes and private vehicle.  Patient denies any pain during dressing change or ambulation and is aware of instructions on how to walk and not place weight on toe area.  Patient to eat lunch and will discharge following that.

## 2015-02-23 NOTE — Progress Notes (Signed)
Patient is medically stable for D/C to [facility] today. Per Kim admissions coordinator at [facility] patient is going to room 222-B. RN will call report at [phone] and arrange EMS for transport if needed. Patient wants to try and get in a car with his wife. Clinical Education officer, museum (CSW) prepared D/C packet and sent D/C Summary to [employer]. Patient is aware of above. Patient's wife is at bedside and aware of above. Please reconsult if future social work needs arise. CSW signing off.   Blima Rich, [place] [phone]

## 2015-02-23 NOTE — Discharge Summary (Signed)
Matthew Mcguire, is a 81 y.o. male  DOB 02-04-1929  MRN [id number].  Admission date:  02/20/2015  Admitting Physician  Gladstone Lighter, MD  Discharge Date:  02/23/2015   Primary MD  Denyla Cortese D, MD  Recommendations for primary care physician for things to follow:   Wound care   Admission Diagnosis  Infection of right foot [L08.9]   Discharge Diagnosis  Infection of right foot [L08.9]  Foot ulcer with cellulitis, DM  Active Problems:   Foot ulcer ([place])      Past Medical History  Diagnosis Date  . Diabetes ([place])     type 1 DM  . CAD (coronary artery disease)     s/p CABG  . Diabetic neuropathy ([place])   . Diabetic retinopathy ([place])   . CKD (chronic kidney disease) stage 3, GFR 30-59 ml/min   . Malignant melanoma ([place])   . Pernicious anemia     Past Surgical History  Procedure Laterality Date  . Coronary artery bypass graft    . Foot surgery    . Toe amputation  2012    great toe on right foot due to melanoma  . Dupuytrens contracture      release on left little finger  . Appendectomy    . Tonsillectomy    . Cataract repair      bilateral  . Fasiectomy      for dupuytrens contracture of right hand       History of present illness and  Hospital Course:     Kindly see H&P for history of present illness and admission details, please review complete Labs, Consult reports and Test reports for all details in brief  HPI  from the history and physical done on the day of admission    Hospital Course   Pt admitted with diabetic foot ulcer/blister with cellulitis. Area debrided by Podiatry. Placed on IV ABX and switched to po. Sugars stable. Now transferred to SNF for further care.   Discharge Condition: stable   Follow UP  Follow-up Information    Follow up with Abdulkarim Eberlin D, MD In 1 week.   Specialty:  Internal Medicine   Contact information:   [place] [address] [phone]         Discharge Instructions  and  Discharge Medications   See below     Medication List    STOP taking these medications        lisinopril 2.5 MG tablet  Commonly known as:  PRINIVIL,ZESTRIL      TAKE these medications        acetaminophen 325 MG tablet  Commonly known as:  TYLENOL  Take 2 tablets (650 mg total) by mouth every 6 (six) hours as needed for mild pain (or Fever >/= 101).     aspirin EC 81 MG tablet  Take 81 mg by mouth daily.     cyanocobalamin 1000 MCG/ML injection  Commonly known as:  (VITAMIN B-12)  Inject 1,000 mcg into  the muscle every 30 (thirty) days.     doxycycline 100 MG tablet  Commonly known as:  VIBRA-TABS  Take 1 tablet (100 mg total) by mouth every 12 (twelve) hours.     gabapentin 400 MG capsule  Commonly known as:  NEURONTIN  Take 800 mg by mouth 3 (three) times daily.     insulin lispro 100 UNIT/ML injection  Commonly known as:  HUMALOG  Inject 10 Units into the skin 3 (three) times daily before meals. And sliding scale depending on blood sugar     LANTUS 100 UNIT/ML injection  Generic drug:  insulin glargine  Inject 30 Units into the skin at bedtime.     meloxicam 7.5 MG tablet  Commonly known as:  MOBIC  Take 7.5 mg by mouth daily.     ondansetron 4 MG tablet  Commonly known as:  ZOFRAN  Take 1 tablet (4 mg total) by mouth every 6 (six) hours as needed for nausea.     silver sulfADIAZINE 1 % cream  Commonly known as:  SILVADENE  Apply topically daily.     VITAMIN D-1000 MAX ST 1000 UNITS tablet  Generic drug:  Cholecalciferol  Take 1,000 Units by mouth daily.          Diet and Activity recommendation: See Discharge Instructions above   Consults obtained - Podiatry, Wound Care   Major procedures and Radiology Reports - PLEASE review detailed and final reports for all details, in brief -   See below   Dg Foot 2 Views Right  02/20/2015   CLINICAL DATA:  Foot redness/swelling with weeping, status post 1st digit amputation EXAM: RIGHT FOOT - 2 VIEW COMPARISON:  None. FINDINGS: Status post 1st digit amputation. Soft tissue swelling/edema at the surgical margin. No underlying cortical irregularity to suggest acute osteomyelitis. Fracture involving the distal aspect of the second proximal phalanx, likely chronic. The joint spaces are preserved. No radiopaque foreign body is seen. IMPRESSION: Status post 1st digit amputation. Associated soft tissue swelling/edema at the surgical margin, correlate for cellulitis. No underlying cortical irregularity to suggest acute osteomyelitis. Fracture involving the distal aspect of the second proximal phalanx, likely chronic. No radiopaque foreign body is seen. Electronically Signed   By: Julian Hy M.D.   On: 02/20/2015 15:23    Micro Results   See below  Recent Results (from the past 240 hour(s))  Culture, blood (routine x 2)     Status: None (Preliminary result)   Collection Time: 02/20/15  2:02 PM  Result Value Ref Range Status   Specimen Description BLOOD RIGHT AC  Final   Special Requests BOTTLES DRAWN AEROBIC AND ANAEROBIC  1CC  Final   Culture NO GROWTH 2 DAYS  Final   Report Status PENDING  Incomplete  Culture, blood (routine x 2)     Status: None (Preliminary result)   Collection Time: 02/20/15  2:21 PM  Result Value Ref Range Status   Specimen Description BLOOD RIGHT AC  Final   Special Requests BOTTLES DRAWN AEROBIC AND ANAEROBIC  1CC  Final   Culture NO GROWTH 2 DAYS  Final   Report Status PENDING  Incomplete  Culture, blood (routine x 2)     Status: None (Preliminary result)   Collection Time: 02/20/15  5:52 PM  Result Value Ref Range Status   Specimen Description BLOOD LEFT HAND  Final   Special Requests BOTTLES DRAWN AEROBIC AND ANAEROBIC  3CC  Final   Culture NO GROWTH 2 DAYS  Final  Report Status PENDING  Incomplete  Wound culture     Status: None (Preliminary  result)   Collection Time: 02/20/15  6:00 PM  Result Value Ref Range Status   Specimen Description WOUND  Final   Special Requests Normal  Final   Gram Stain FEW WBC SEEN NO ORGANISMS SEEN   Final   Culture HOLDING FOR POSSIBLE PATHOGEN  Final   Report Status PENDING  Incomplete  Culture, blood (routine x 2)     Status: None (Preliminary result)   Collection Time: 02/20/15  6:02 PM  Result Value Ref Range Status   Specimen Description BLOOD RIGHT HAND  Final   Special Requests BOTTLES DRAWN AEROBIC AND ANAEROBIC  4CC  Final   Culture NO GROWTH 2 DAYS  Final   Report Status PENDING  Incomplete       Today   Subjective:   Nigil Braman today has no headache,no chest abdominal pain,no new weakness tingling or numbness, feels much better wants to go to SNF today  Objective:   Blood pressure 104/52, pulse 74, temperature 98.2 F (36.8 C), temperature source Oral, resp. rate 17, height 6' (1.829 m), weight 110.678 kg (244 lb), SpO2 98 %.   Intake/Output Summary (Last 24 hours) at 02/23/15 0748 Last data filed at 02/23/15 0537  Gross per 24 hour  Intake   2940 ml  Output   1400 ml  Net   1540 ml    Exam Awake Alert, Oriented x 3, No new F.N deficits, Normal affect [place].AT,PERRAL Supple Neck,No JVD, No cervical lymphadenopathy appriciated.  Symmetrical Chest wall movement, Good air movement bilaterally, CTAB RRR,No Gallops,Rubs or new Murmurs, No Parasternal Heave +ve B.Sounds, Abd Soft, Non tender, No organomegaly appriciated, No rebound -guarding or rigidity. No Cyanosis, Clubbing. 1+ edema noted, No new Rash or bruise  Data Review   CBC w Diff: Lab Results  Component Value Date   WBC 6.1 02/23/2015   WBC 5.9 08/26/2014   HGB 11.1 02/23/2015   HGB 13.5 08/26/2014   HCT 31.8 02/23/2015   HCT 40.2 08/26/2014   PLT 90 02/23/2015   PLT 155 08/26/2014   LYMPHOPCT 28% 02/23/2015   LYMPHOPCT 36.2 08/26/2014   MONOPCT 13% 02/23/2015   MONOPCT 8.1 08/26/2014    EOSPCT 3% 02/23/2015   EOSPCT 3.9 08/26/2014   BASOPCT 1% 02/23/2015   BASOPCT 1.1 08/26/2014    CMP: Lab Results  Component Value Date   NA 139 02/23/2015   NA 138 08/26/2014   K 4.0 02/23/2015   K 4.1 08/26/2014   CL 104 02/23/2015   CL 104 08/26/2014   CO2 30 02/23/2015   CO2 29 08/26/2014   BUN 16 02/23/2015   BUN 18 08/26/2014   CREATININE 1.59* 02/23/2015   CREATININE 1.25* 08/26/2014   PROT 6.7 02/20/2015   PROT 7.2 08/26/2014   ALBUMIN 3.7 02/20/2015   ALBUMIN 4.0 08/26/2014   BILITOT 0.5 02/20/2015   BILITOT 0.9 08/26/2014   ALKPHOS 72 02/20/2015   ALKPHOS 72 08/26/2014   AST 21 02/20/2015   AST 23 08/26/2014   ALT 12* 02/20/2015   ALT 12* 08/26/2014  .   Total Time in preparing paper work, data evaluation and todays exam - 14 minutes  Shanelle Clontz D M.D on 02/23/2015 at 7:48 AM

## 2015-02-23 NOTE — Clinical Social Work Placement (Signed)
   CLINICAL SOCIAL WORK PLACEMENT  NOTE  Date:  02/23/2015  Patient Details  Name: Matthew Mcguire MRN: [id number] Date of Birth: [birth year]  Clinical Social Work is seeking post-discharge placement for this patient at the [place] level of care (*CSW will initial, date and re-position this form in  chart as items are completed):  Yes   Patient/family provided with [facility] Work Department's list of facilities offering this level of care within the geographic area requested by the patient (or if unable, by the patient's family).  Yes   Patient/family informed of their freedom to choose among providers that offer the needed level of care, that participate in Medicare, Medicaid or managed care program needed by the patient, have an available bed and are willing to accept the patient.  Yes   Patient/family informed of 's ownership interest in [facility] and [facility], as well as of the fact that they are under no obligation to receive care at these facilities.  PASRR submitted to EDS on       PASRR number received on       Existing PASRR number confirmed on 02/21/15     FL2 transmitted to all facilities in geographic area requested by pt/family on 02/21/15     FL2 transmitted to all facilities within larger geographic area on       Patient informed that his/her managed care company has contracts with or will negotiate with certain facilities, including the following:        Yes   Patient/family informed of bed offers received.  Patient chooses bed at  [facility])     Physician recommends and patient chooses bed at      Patient to be transferred to  [facility] ) on 02/23/15.  Patient to be transferred to facility by  [facility] )     Patient family notified on 02/23/15 of transfer.  Name of family member notified:   (Wife is at bedside and aware of D/C. )     PHYSICIAN       Additional Comment:     _______________________________________________ Loralyn Freshwater, LCSW 02/23/2015, 10:19 AM

## 2015-02-24 LAB — GLUCOSE, CAPILLARY
GLUCOSE-CAPILLARY: 177 mg/dL — AB (ref 65–99)
Glucose-Capillary: 154 mg/dL — ABNORMAL HIGH (ref 65–99)
Glucose-Capillary: 119 mg/dL — ABNORMAL HIGH (ref 65–99)

## 2015-02-25 ENCOUNTER — Other Ambulatory Visit: Payer: Self-pay | Admitting: *Deleted

## 2015-02-25 DIAGNOSIS — C439 Malignant melanoma of skin, unspecified: Secondary | ICD-10-CM

## 2015-02-25 LAB — GLUCOSE, CAPILLARY
GLUCOSE-CAPILLARY: 176 mg/dL — AB (ref 65–99)
Glucose-Capillary: 163 mg/dL — ABNORMAL HIGH (ref 65–99)
Glucose-Capillary: 59 mg/dL — ABNORMAL LOW (ref 65–99)
Glucose-Capillary: 69 mg/dL (ref 65–99)

## 2015-02-26 LAB — CULTURE, BLOOD (ROUTINE X 2)
CULTURE: NO GROWTH
CULTURE: NO GROWTH
Culture: NO GROWTH
Culture: NO GROWTH

## 2015-02-26 LAB — GLUCOSE, CAPILLARY
GLUCOSE-CAPILLARY: 199 mg/dL — AB (ref 65–99)
GLUCOSE-CAPILLARY: 86 mg/dL (ref 65–99)
GLUCOSE-CAPILLARY: 240 mg/dL — AB (ref 65–99)
Glucose-Capillary: 235 mg/dL — ABNORMAL HIGH (ref 65–99)

## 2015-02-27 LAB — GLUCOSE, CAPILLARY
GLUCOSE-CAPILLARY: 153 mg/dL — AB (ref 65–99)
GLUCOSE-CAPILLARY: 129 mg/dL — AB (ref 65–99)
GLUCOSE-CAPILLARY: 257 mg/dL — AB (ref 65–99)
Glucose-Capillary: 154 mg/dL — ABNORMAL HIGH (ref 65–99)

## 2015-02-28 ENCOUNTER — Inpatient Hospital Stay: Payer: Medicare Other | Admitting: Oncology

## 2015-02-28 ENCOUNTER — Inpatient Hospital Stay: Payer: Medicare Other | Attending: Oncology

## 2015-02-28 LAB — GLUCOSE, CAPILLARY
GLUCOSE-CAPILLARY: 120 mg/dL — AB (ref 65–99)
GLUCOSE-CAPILLARY: 87 mg/dL (ref 65–99)
Glucose-Capillary: 212 mg/dL — ABNORMAL HIGH (ref 65–99)
Glucose-Capillary: 91 mg/dL (ref 65–99)
Glucose-Capillary: 246 mg/dL — ABNORMAL HIGH (ref 65–99)

## 2015-03-01 ENCOUNTER — Encounter
Admission: RE | Admit: 2015-03-01 | Discharge: 2015-03-01 | Disposition: A | Discharge status: Home / Self Care | Payer: Medicare Other | Source: Ambulatory Visit | Attending: Internal Medicine | Admitting: Internal Medicine

## 2015-03-01 LAB — GLUCOSE, CAPILLARY
GLUCOSE-CAPILLARY: 193 mg/dL — AB (ref 65–99)
GLUCOSE-CAPILLARY: 75 mg/dL (ref 65–99)
Glucose-Capillary: 171 mg/dL — ABNORMAL HIGH (ref 65–99)
Glucose-Capillary: 135 mg/dL — ABNORMAL HIGH (ref 65–99)

## 2015-03-01 LAB — BASIC METABOLIC PANEL
Anion gap: 9 (ref 5–15)
BUN: 27 mg/dL — ABNORMAL HIGH (ref 6–20)
CHLORIDE: 100 mmol/L — AB (ref 101–111)
CO2: 29 mmol/L (ref 22–32)
CREATININE: 1.47 mg/dL — AB (ref 0.61–1.24)
Calcium: 8.8 mg/dL — ABNORMAL LOW (ref 8.9–10.3)
GFR calc non Af Amer: 42 mL/min — ABNORMAL LOW (ref >60)
GFR, EST AFRICAN AMERICAN: 49 mL/min — AB (ref >60)
Glucose, Bld: 247 mg/dL — ABNORMAL HIGH (ref 65–99)
Potassium: 4.2 mmol/L (ref 3.5–5.1)
Sodium: 138 mmol/L (ref 135–145)

## 2015-03-02 LAB — GLUCOSE, CAPILLARY
Glucose-Capillary: 70 mg/dL (ref 65–99)
Glucose-Capillary: 226 mg/dL — ABNORMAL HIGH (ref 65–99)
Glucose-Capillary: 250 mg/dL — ABNORMAL HIGH (ref 65–99)

## 2015-03-03 LAB — GLUCOSE, CAPILLARY
GLUCOSE-CAPILLARY: 116 mg/dL — AB (ref 65–99)
Glucose-Capillary: 66 mg/dL (ref 65–99)
Glucose-Capillary: 142 mg/dL — ABNORMAL HIGH (ref 65–99)

## 2015-03-04 LAB — GLUCOSE, CAPILLARY
GLUCOSE-CAPILLARY: 109 mg/dL — AB (ref 65–99)
GLUCOSE-CAPILLARY: 100 mg/dL — AB (ref 65–99)
GLUCOSE-CAPILLARY: 71 mg/dL (ref 65–99)
GLUCOSE-CAPILLARY: 117 mg/dL — AB (ref 65–99)
Glucose-Capillary: 78 mg/dL (ref 65–99)
Glucose-Capillary: 133 mg/dL — ABNORMAL HIGH (ref 65–99)

## 2015-03-05 LAB — GLUCOSE, CAPILLARY
GLUCOSE-CAPILLARY: 103 mg/dL — AB (ref 65–99)
Glucose-Capillary: 132 mg/dL — ABNORMAL HIGH (ref 65–99)
Glucose-Capillary: 138 mg/dL — ABNORMAL HIGH (ref 65–99)
Glucose-Capillary: 161 mg/dL — ABNORMAL HIGH (ref 65–99)

## 2015-03-06 LAB — GLUCOSE, CAPILLARY
Glucose-Capillary: 176 mg/dL — ABNORMAL HIGH (ref 65–99)
Glucose-Capillary: 197 mg/dL — ABNORMAL HIGH (ref 65–99)

## 2015-03-07 LAB — GLUCOSE, CAPILLARY
GLUCOSE-CAPILLARY: 122 mg/dL — AB (ref 65–99)
Glucose-Capillary: 139 mg/dL — ABNORMAL HIGH (ref 65–99)
Glucose-Capillary: 225 mg/dL — ABNORMAL HIGH (ref 65–99)
Glucose-Capillary: 247 mg/dL — ABNORMAL HIGH (ref 65–99)

## 2015-03-08 LAB — GLUCOSE, CAPILLARY
GLUCOSE-CAPILLARY: 153 mg/dL — AB (ref 65–99)
GLUCOSE-CAPILLARY: 91 mg/dL (ref 65–99)
Glucose-Capillary: 101 mg/dL — ABNORMAL HIGH (ref 65–99)
Glucose-Capillary: 72 mg/dL (ref 65–99)

## 2015-03-09 LAB — GLUCOSE, CAPILLARY
GLUCOSE-CAPILLARY: 88 mg/dL (ref 65–99)
GLUCOSE-CAPILLARY: 151 mg/dL — AB (ref 65–99)
Glucose-Capillary: 161 mg/dL — ABNORMAL HIGH (ref 65–99)
Glucose-Capillary: 94 mg/dL (ref 65–99)
Glucose-Capillary: 36 mg/dL — CL (ref 65–99)
Glucose-Capillary: 45 mg/dL — ABNORMAL LOW (ref 65–99)
Glucose-Capillary: 97 mg/dL (ref 65–99)
Glucose-Capillary: 119 mg/dL — ABNORMAL HIGH (ref 65–99)

## 2015-03-10 LAB — GLUCOSE, CAPILLARY
Glucose-Capillary: 190 mg/dL — ABNORMAL HIGH (ref 65–99)
Glucose-Capillary: 246 mg/dL — ABNORMAL HIGH (ref 65–99)
Glucose-Capillary: 123 mg/dL — ABNORMAL HIGH (ref 65–99)
Glucose-Capillary: 70 mg/dL (ref 65–99)

## 2015-03-11 LAB — GLUCOSE, CAPILLARY
GLUCOSE-CAPILLARY: 86 mg/dL (ref 65–99)
GLUCOSE-CAPILLARY: 215 mg/dL — AB (ref 65–99)
GLUCOSE-CAPILLARY: 287 mg/dL — AB (ref 65–99)
Glucose-Capillary: 119 mg/dL — ABNORMAL HIGH (ref 65–99)
Glucose-Capillary: 47 mg/dL — ABNORMAL LOW (ref 65–99)

## 2015-03-12 LAB — GLUCOSE, CAPILLARY
Glucose-Capillary: 238 mg/dL — ABNORMAL HIGH (ref 65–99)
Glucose-Capillary: 49 mg/dL — ABNORMAL LOW (ref 65–99)
Glucose-Capillary: 281 mg/dL — ABNORMAL HIGH (ref 65–99)

## 2015-03-13 LAB — GLUCOSE, CAPILLARY
GLUCOSE-CAPILLARY: 141 mg/dL — AB (ref 65–99)
GLUCOSE-CAPILLARY: 95 mg/dL (ref 65–99)
GLUCOSE-CAPILLARY: 79 mg/dL (ref 65–99)

## 2015-03-14 LAB — GLUCOSE, CAPILLARY
GLUCOSE-CAPILLARY: 173 mg/dL — AB (ref 65–99)
GLUCOSE-CAPILLARY: 292 mg/dL — AB (ref 65–99)
GLUCOSE-CAPILLARY: 293 mg/dL — AB (ref 65–99)
Glucose-Capillary: 228 mg/dL — ABNORMAL HIGH (ref 65–99)

## 2015-03-15 LAB — GLUCOSE, CAPILLARY
GLUCOSE-CAPILLARY: 238 mg/dL — AB (ref 65–99)
Glucose-Capillary: 118 mg/dL — ABNORMAL HIGH (ref 65–99)

## 2015-04-20 ENCOUNTER — Other Ambulatory Visit
Admission: RE | Admit: 2015-04-20 | Discharge: 2015-04-20 | Disposition: A | Discharge status: Home / Self Care | Payer: Medicare Other | Source: Other Acute Inpatient Hospital | Attending: Podiatry | Admitting: Podiatry

## 2015-04-20 DIAGNOSIS — L97512 Non-pressure chronic ulcer of other part of right foot with fat layer exposed: Secondary | ICD-10-CM | POA: Diagnosis present

## 2015-04-24 LAB — ANAEROBIC CULTURE

## 2015-04-24 LAB — WOUND CULTURE: Culture: NO GROWTH

## 2016-02-04 ENCOUNTER — Emergency Department: Payer: Medicare Other | Admitting: Anesthesiology

## 2016-02-04 ENCOUNTER — Emergency Department: Payer: Medicare Other

## 2016-02-04 ENCOUNTER — Encounter: Admission: EM | Disposition: E | Payer: Self-pay | Source: Home / Self Care | Attending: Surgery

## 2016-02-04 ENCOUNTER — Inpatient Hospital Stay
Admission: EM | Admit: 2016-02-04 | Discharge: 2016-02-29 | DRG: 853 | Disposition: E | Payer: Medicare Other | Attending: Surgery | Admitting: Surgery

## 2016-02-04 ENCOUNTER — Encounter: Payer: Self-pay | Admitting: Emergency Medicine

## 2016-02-04 DIAGNOSIS — Z79899 Other long term (current) drug therapy: Secondary | ICD-10-CM

## 2016-02-04 DIAGNOSIS — E669 Obesity, unspecified: Secondary | ICD-10-CM | POA: Diagnosis present

## 2016-02-04 DIAGNOSIS — Z66 Do not resuscitate: Secondary | ICD-10-CM | POA: Diagnosis present

## 2016-02-04 DIAGNOSIS — E1040 Type 1 diabetes mellitus with diabetic neuropathy, unspecified: Secondary | ICD-10-CM | POA: Diagnosis present

## 2016-02-04 DIAGNOSIS — Z951 Presence of aortocoronary bypass graft: Secondary | ICD-10-CM | POA: Diagnosis not present

## 2016-02-04 DIAGNOSIS — I7589 Atheroembolism of other site: Secondary | ICD-10-CM | POA: Diagnosis present

## 2016-02-04 DIAGNOSIS — A419 Sepsis, unspecified organism: Principal | ICD-10-CM | POA: Diagnosis present

## 2016-02-04 DIAGNOSIS — D510 Vitamin B12 deficiency anemia due to intrinsic factor deficiency: Secondary | ICD-10-CM | POA: Diagnosis present

## 2016-02-04 DIAGNOSIS — Z7982 Long term (current) use of aspirin: Secondary | ICD-10-CM | POA: Diagnosis not present

## 2016-02-04 DIAGNOSIS — K551 Chronic vascular disorders of intestine: Secondary | ICD-10-CM | POA: Diagnosis present

## 2016-02-04 DIAGNOSIS — Z9842 Cataract extraction status, left eye: Secondary | ICD-10-CM | POA: Diagnosis not present

## 2016-02-04 DIAGNOSIS — N183 Chronic kidney disease, stage 3 (moderate): Secondary | ICD-10-CM | POA: Diagnosis present

## 2016-02-04 DIAGNOSIS — E1022 Type 1 diabetes mellitus with diabetic chronic kidney disease: Secondary | ICD-10-CM | POA: Diagnosis present

## 2016-02-04 DIAGNOSIS — I2510 Atherosclerotic heart disease of native coronary artery without angina pectoris: Secondary | ICD-10-CM | POA: Diagnosis present

## 2016-02-04 DIAGNOSIS — E10319 Type 1 diabetes mellitus with unspecified diabetic retinopathy without macular edema: Secondary | ICD-10-CM | POA: Diagnosis present

## 2016-02-04 DIAGNOSIS — K559 Vascular disorder of intestine, unspecified: Secondary | ICD-10-CM | POA: Diagnosis present

## 2016-02-04 DIAGNOSIS — K55011 Focal (segmental) acute (reversible) ischemia of small intestine: Secondary | ICD-10-CM | POA: Diagnosis present

## 2016-02-04 DIAGNOSIS — I2584 Coronary atherosclerosis due to calcified coronary lesion: Secondary | ICD-10-CM

## 2016-02-04 DIAGNOSIS — Z794 Long term (current) use of insulin: Secondary | ICD-10-CM

## 2016-02-04 DIAGNOSIS — Z6834 Body mass index (BMI) 34.0-34.9, adult: Secondary | ICD-10-CM | POA: Diagnosis not present

## 2016-02-04 DIAGNOSIS — Z9841 Cataract extraction status, right eye: Secondary | ICD-10-CM | POA: Diagnosis not present

## 2016-02-04 DIAGNOSIS — Z515 Encounter for palliative care: Secondary | ICD-10-CM | POA: Diagnosis present

## 2016-02-04 DIAGNOSIS — Z85820 Personal history of malignant melanoma of skin: Secondary | ICD-10-CM | POA: Diagnosis not present

## 2016-02-04 DIAGNOSIS — Z823 Family history of stroke: Secondary | ICD-10-CM

## 2016-02-04 DIAGNOSIS — R1084 Generalized abdominal pain: Secondary | ICD-10-CM | POA: Diagnosis present

## 2016-02-04 DIAGNOSIS — R100 Acute abdomen: Secondary | ICD-10-CM | POA: Insufficient documentation

## 2016-02-04 DIAGNOSIS — E872 Acidosis: Secondary | ICD-10-CM | POA: Diagnosis present

## 2016-02-04 DIAGNOSIS — Z89411 Acquired absence of right great toe: Secondary | ICD-10-CM

## 2016-02-04 DIAGNOSIS — F17220 Nicotine dependence, chewing tobacco, uncomplicated: Secondary | ICD-10-CM | POA: Diagnosis present

## 2016-02-04 HISTORY — PX: LAPAROTOMY: SHX154

## 2016-02-04 LAB — COMPREHENSIVE METABOLIC PANEL
ALK PHOS: 66 U/L (ref 38–126)
ALT: 16 U/L — AB (ref 17–63)
ANION GAP: 8 (ref 5–15)
AST: 35 U/L (ref 15–41)
Albumin: 3.8 g/dL (ref 3.5–5.0)
BILIRUBIN TOTAL: 0.6 mg/dL (ref 0.3–1.2)
BUN: 21 mg/dL — ABNORMAL HIGH (ref 6–20)
CALCIUM: 9.1 mg/dL (ref 8.9–10.3)
CO2: 26 mmol/L (ref 22–32)
CREATININE: 1.51 mg/dL — AB (ref 0.61–1.24)
Chloride: 106 mmol/L (ref 101–111)
GFR calc non Af Amer: 40 mL/min — ABNORMAL LOW (ref >60)
GFR, EST AFRICAN AMERICAN: 47 mL/min — AB (ref >60)
Glucose, Bld: 111 mg/dL — ABNORMAL HIGH (ref 65–99)
Potassium: 3.9 mmol/L (ref 3.5–5.1)
SODIUM: 140 mmol/L (ref 135–145)
TOTAL PROTEIN: 6.7 g/dL (ref 6.5–8.1)

## 2016-02-04 LAB — CBC WITH DIFFERENTIAL/PLATELET
Basophils Absolute: 0.1 10*3/uL (ref 0–0.1)
Basophils Relative: 1 %
EOS ABS: 0.1 10*3/uL (ref 0–0.7)
Eosinophils Relative: 1 %
HEMATOCRIT: 39.6 % — AB (ref 40.0–52.0)
HEMOGLOBIN: 13.6 g/dL (ref 13.0–18.0)
LYMPHS ABS: 3 10*3/uL (ref 1.0–3.6)
LYMPHS PCT: 32 %
MCH: 32.2 pg (ref 26.0–34.0)
MCHC: 34.2 g/dL (ref 32.0–36.0)
MCV: 94 fL (ref 80.0–100.0)
MONOS PCT: 6 %
Monocytes Absolute: 0.5 10*3/uL (ref 0.2–1.0)
NEUTROS ABS: 5.7 10*3/uL (ref 1.4–6.5)
NEUTROS PCT: 60 %
Platelets: 136 10*3/uL — ABNORMAL LOW (ref 150–440)
RBC: 4.21 MIL/uL — AB (ref 4.40–5.90)
RDW: 15 % — ABNORMAL HIGH (ref 11.5–14.5)
WBC: 9.4 10*3/uL (ref 3.8–10.6)

## 2016-02-04 LAB — PROTIME-INR
INR: 1.14
Prothrombin Time: 14.7 seconds (ref 11.4–15.2)

## 2016-02-04 LAB — APTT: APTT: 34 s (ref 24–36)

## 2016-02-04 LAB — TROPONIN I: Troponin I: <0.03 ng/mL (ref <0.03)

## 2016-02-04 LAB — LACTIC ACID, PLASMA: LACTIC ACID, VENOUS: 2.4 mmol/L — AB (ref 0.5–1.9)

## 2016-02-04 LAB — GLUCOSE, CAPILLARY: GLUCOSE-CAPILLARY: 96 mg/dL (ref 65–99)

## 2016-02-04 SURGERY — LAPAROTOMY, EXPLORATORY
Anesthesia: General

## 2016-02-04 MED ORDER — DEXTROSE 5 % IV SOLN
2.0000 g | Freq: Once | INTRAVENOUS | Status: AC
  Administered 2016-02-04: 2 g via INTRAVENOUS
  Filled 2016-02-04: qty 2

## 2016-02-04 MED ORDER — ETOMIDATE 2 MG/ML IV SOLN
INTRAVENOUS | Status: DC | PRN
  Administered 2016-02-04: 16 mg via INTRAVENOUS

## 2016-02-04 MED ORDER — LACTATED RINGERS IV SOLN
INTRAVENOUS | Status: DC
  Administered 2016-02-05: 04:00:00 via INTRAVENOUS

## 2016-02-04 MED ORDER — PHENYLEPHRINE HCL 10 MG/ML IJ SOLN
INTRAMUSCULAR | Status: DC | PRN
  Administered 2016-02-04 (×2): 300 ug via INTRAVENOUS
  Administered 2016-02-04: 100 ug via INTRAVENOUS
  Administered 2016-02-04: 300 ug via INTRAVENOUS

## 2016-02-04 MED ORDER — ONDANSETRON HCL 4 MG/2ML IJ SOLN
INTRAMUSCULAR | Status: DC | PRN
  Administered 2016-02-04: 4 mg via INTRAVENOUS

## 2016-02-04 MED ORDER — ONDANSETRON HCL 4 MG/2ML IJ SOLN: 4.0000 mg | Freq: Once | INTRAMUSCULAR | Status: DC | PRN

## 2016-02-04 MED ORDER — ALBUMIN HUMAN 5 % IV SOLN
INTRAVENOUS | Status: DC | PRN
  Administered 2016-02-04: 21:00:00 via INTRAVENOUS

## 2016-02-04 MED ORDER — SUCCINYLCHOLINE CHLORIDE 20 MG/ML IJ SOLN
INTRAMUSCULAR | Status: DC | PRN
  Administered 2016-02-04: 100 mg via INTRAVENOUS

## 2016-02-04 MED ORDER — MORPHINE SULFATE (PF) 4 MG/ML IV SOLN
4.0000 mg | INTRAVENOUS | Status: DC | PRN
  Administered 2016-02-04: 4 mg via INTRAVENOUS
  Filled 2016-02-04: qty 1

## 2016-02-04 MED ORDER — MORPHINE SULFATE (PF) 4 MG/ML IV SOLN
4.0000 mg | Freq: Once | INTRAVENOUS | Status: AC
  Administered 2016-02-04: 4 mg via INTRAVENOUS

## 2016-02-04 MED ORDER — LIDOCAINE HCL (PF) 2 % IJ SOLN
INTRAMUSCULAR | Status: DC | PRN
  Administered 2016-02-04: 60 mg via INTRADERMAL

## 2016-02-04 MED ORDER — ONDANSETRON HCL 4 MG/2ML IJ SOLN
INTRAMUSCULAR | Status: AC
  Administered 2016-02-04: 4 mg via INTRAVENOUS
  Filled 2016-02-04: qty 2

## 2016-02-04 MED ORDER — BUPIVACAINE HCL 0.25 % IJ SOLN
INTRAMUSCULAR | Status: DC | PRN
  Administered 2016-02-04: 30 mL

## 2016-02-04 MED ORDER — MORPHINE SULFATE (PF) 4 MG/ML IV SOLN
INTRAVENOUS | Status: AC
  Administered 2016-02-04: 4 mg via INTRAVENOUS
  Filled 2016-02-04: qty 1

## 2016-02-04 MED ORDER — LACTATED RINGERS IV SOLN
INTRAVENOUS | Status: DC | PRN
  Administered 2016-02-04: 20:00:00 via INTRAVENOUS

## 2016-02-04 MED ORDER — ONDANSETRON HCL 4 MG/2ML IJ SOLN
4.0000 mg | Freq: Once | INTRAMUSCULAR | Status: AC
  Administered 2016-02-04: 4 mg via INTRAVENOUS

## 2016-02-04 MED ORDER — IOPAMIDOL (ISOVUE-370) INJECTION 76%
75.0000 mL | Freq: Once | INTRAVENOUS | Status: AC | PRN
  Administered 2016-02-04: 75 mL via INTRAVENOUS

## 2016-02-04 MED ORDER — LACTATED RINGERS IV SOLN
INTRAVENOUS | Status: DC | PRN
  Administered 2016-02-04 (×2): via INTRAVENOUS

## 2016-02-04 MED ORDER — FENTANYL CITRATE (PF) 100 MCG/2ML IJ SOLN
25.0000 ug | INTRAMUSCULAR | Status: DC | PRN
  Administered 2016-02-04 – 2016-02-05 (×5): 25 ug via INTRAVENOUS
  Filled 2016-02-04 (×5): qty 2

## 2016-02-04 MED ORDER — HEPARIN SODIUM (PORCINE) 5000 UNIT/ML IJ SOLN: 5000.0000 [IU] | Freq: Three times a day (TID) | INTRAMUSCULAR | Status: DC

## 2016-02-04 MED ORDER — ROCURONIUM 10MG/ML (10ML) SYRINGE FOR MEDFUSION PUMP - OPTIME
INTRAVENOUS | Status: DC | PRN
  Administered 2016-02-04: 50 mg via INTRAVENOUS

## 2016-02-04 SURGICAL SUPPLY — 34 items
BINDER ABDOMINAL 12 ML 46-62 (SOFTGOODS) ×3 IMPLANT
CANISTER SUCT 1200ML W/VALVE (MISCELLANEOUS) ×3 IMPLANT
CATH TRAY 16F METER LATEX (MISCELLANEOUS) ×3 IMPLANT
CHLORAPREP W/TINT 26ML (MISCELLANEOUS) ×3 IMPLANT
DRAPE LAPAROTOMY 100X77 ABD (DRAPES) ×3 IMPLANT
DRSG TELFA 3X8 NADH (GAUZE/BANDAGES/DRESSINGS) ×3 IMPLANT
ELECT REM PT RETURN 9FT ADLT (ELECTROSURGICAL) ×3
ELECTRODE REM PT RTRN 9FT ADLT (ELECTROSURGICAL) ×1 IMPLANT
GAUZE SPONGE 4X4 12PLY STRL (GAUZE/BANDAGES/DRESSINGS) ×3 IMPLANT
GLOVE BIO SURGEON STRL SZ8 (GLOVE) ×18 IMPLANT
GOWN STRL REUS W/ TWL LRG LVL3 (GOWN DISPOSABLE) ×3 IMPLANT
GOWN STRL REUS W/TWL LRG LVL3 (GOWN DISPOSABLE) ×6
HOLDER FOLEY CATH W/STRAP (MISCELLANEOUS) ×3 IMPLANT
KIT RM TURNOVER STRD PROC AR (KITS) ×3 IMPLANT
LABEL OR SOLS (LABEL) ×3 IMPLANT
NDL SAFETY 22GX1.5 (NEEDLE) IMPLANT
NEEDLE HYPO 22GX1.5 SAFETY (NEEDLE) ×3 IMPLANT
NS IRRIG 1000ML POUR BTL (IV SOLUTION) ×3 IMPLANT
PACK BASIN MAJOR ARMC (MISCELLANEOUS) ×3 IMPLANT
PACK COLON CLEAN CLOSURE (MISCELLANEOUS) IMPLANT
PAD ABD DERMACEA PRESS 5X9 (GAUZE/BANDAGES/DRESSINGS) ×3 IMPLANT
RELOAD LINEAR CUT PROX 55 BLUE (ENDOMECHANICALS) ×3 IMPLANT
SEPRAFILM MEMBRANE 5X6 (MISCELLANEOUS) IMPLANT
STAPLER PROXIMATE 55 BLUE (STAPLE) ×3 IMPLANT
STAPLER SKIN PROX 35W (STAPLE) ×3 IMPLANT
SUT PDS AB 1 TP1 54 (SUTURE) ×6 IMPLANT
SUT PROLENE 0 CT 1 30 (SUTURE) IMPLANT
SUT SILK 3-0 (SUTURE) IMPLANT
SUT VIC AB 3-0 SH 27 (SUTURE)
SUT VIC AB 3-0 SH 27X BRD (SUTURE) IMPLANT
SUT VICRYL 2 0 18  UND BR (SUTURE)
SUT VICRYL 2 0 18 UND BR (SUTURE) IMPLANT
SYRINGE 10CC LL (SYRINGE) IMPLANT
SYRINGE 12CC LL (MISCELLANEOUS) ×3 IMPLANT

## 2016-02-04 NOTE — Progress Notes (Signed)
I returned a second time to the ICU waiting area and discussed the patient's care with the wife pastor and neighbor. I believe that they were making the correct decision in this patient would not likely survive any sort of further surgical therapy. I also then broached the question of resuscitation status. As this was an obvious and foregone conclusion but the discussion was made to insure that all members understood and the plan and the outcome.  Patient is made a DO NOT RESUSCITATE with the family members understanding this decision.  This was discussed with nursing staff in the ICU. Palliative care will be consult and in the morning and comfort care tonight. We will not institute pressors etc.

## 2016-02-04 NOTE — Op Note (Signed)
Pre-operative Diagnosis: Intra-abdominal catastrophe  Post-operative Diagnosis: Ischemic bowel with necrosis  Surgeon: Phoebe Perch   Assistants: Surgical tech  Surgeon: Jerrol Banana. Burt Knack, MD FACS  Anesthesia: Gen. with endotracheal tube   Procedure Details  This a patient with likely ischemic bowel. He is presenting as an intra-abdominal catastrophe an warrants exploration. His family has decided to proceed with exploration. I discussed with them the high risk of the inability of being able to perform any sort of definitive care in a patient with an intra-abdominal catastrophe and likely ischemic bowel. I discussed with them the risks of bleeding and infection and opening and closing. This was reviewed with them. The risks of bleeding, infection, recurrence of symptoms, failure to resolve symptoms,  bowel injury, any of which could require further surgery were reviewed with the patient.   The patient was taken to Operating Room, identified as Matthew Mcguire and the procedure verified.  A Time Out was held and the above information confirmed.  Prior to the induction of general anesthesia, antibiotic prophylaxis was administered. VTE prophylaxis was in place. General endotracheal anesthesia was then administered and tolerated well. After the induction, the abdomen was prepped with Chloraprep and draped in the sterile fashion. The patient was positioned in the supine position.  A midline incision was utilized to open and explore the abdominal cavity. A fetid odor was identified immediately and very pale bowel was identified. The small bowel was elevated and found to be frankly necrotic especially in the ileum. From the ileocecal valve disease sparing the actual ileocecal valve and cecum) the bowel was identified and found to be frankly necrotic up to the jejunum. There were no palpable pulses in the mesentery and the bowel did not peristalse. The transition zone in the jejunum. Further  exploration proximal demonstrated 2 other separate segments measuring 10-15 cm each which were ischemic as well and likely progressing to frank necrosis.  With this in mind and these findings it was believed that this is likely secondary to an embolus shower occluding multiple vessels. The colon itself was spared.  I discussed these findings with the patient's wife pastor and neighbor. Nursing was present. I discussed with them the strong likelihood that this would not result in a favorable outcome for this patient and that there was a strong likelihood that he would die from this operation regardless of the choice of operation. The severity and extent of necrosis suggested that he would become a nutritional cripple should he even survive. After proximally 30 minutes of counseling with the patient's pastor it was decided to close the patient's abdomen. No further treatment would be instituted.  I returned to the operating room and re-scrubbed. The wound was checked for sponge lap needle count and then hemostasis was maintained with electrocautery Marcaine was infiltrated in the skin was obtained his tissues and then #1 PDS was utilized in a running fashion to close the abdominal wall. Skin staples were placed and a sterile dressing was placed with an abdominal binder and AVD pads.  Sponge lap needle count was correct he tolerated this procedure but will likely succumb to his ischemic bowel in the near future.  Findings: As above   Estimated Blood Loss: Minimal         Drains: None         Specimens: None        Complications: None                  Condition:  Grave   Aurianna Earlywine E. Burt Knack, MD, FACS

## 2016-02-04 NOTE — ED Notes (Signed)
Pt gave wedding band to wife. He has no other jewelry on. Dentures have also been removed.

## 2016-02-04 NOTE — Progress Notes (Signed)
Visited with family members and RN and patient. Patient unstable and on ventilator hypotensive.. Comfort Care at this point.

## 2016-02-04 NOTE — Anesthesia Procedure Notes (Signed)
Procedure Name: Intubation Date/Time: 02/10/2016 8:34 PM Performed by: Clinton Sawyer Pre-anesthesia Checklist: Patient identified, Emergency Drugs available, Suction available, Patient being monitored and Timeout performed Patient Re-evaluated:Patient Re-evaluated prior to inductionOxygen Delivery Method: Circle system utilized Preoxygenation: Pre-oxygenation with 100% oxygen Intubation Type: Rapid sequence, IV induction and Cricoid Pressure applied Laryngoscope Size: Mac and 4 Grade View: Grade I Tube type: Oral Tube size: 8.0 mm Number of attempts: 1 Airway Equipment and Method: Stylet and Patient positioned with wedge pillow Placement Confirmation: positive ETCO2,  ETT inserted through vocal cords under direct vision,  CO2 detector and breath sounds checked- equal and bilateral Secured at: 22 cm Tube secured with: Tape Dental Injury: Teeth and Oropharynx as per pre-operative assessment

## 2016-02-04 NOTE — ED Notes (Signed)
Patient asked to use the bathroom urgently.  Patient placed in bathroom in triage.  Patient remains AAOx3.  NAD.  Remains in bathroom at this time, patient being check on frequently.

## 2016-02-04 NOTE — H&P (Signed)
Matthew Mcguire is an 82 y.o. male.    Chief Complaint: Abdominal pain  HPI: This a patient with the onset acutely of abdominal pain he pointed to the epigastrium where it started and is now in both lower quadrants especially on the right side he's never had an episode like this before nausea but no emesis he's had some loose stools today but had not gone for approximately 3 days prior. Nice fevers or chills denies melena or hematochezia  Workup in the emergency room suggested new [place] area pneumatosis intestinalis and in a patient who is acutely ill.  Past Medical History:  Diagnosis Date  . CAD (coronary artery disease)    s/p CABG  . CKD (chronic kidney disease) stage 3, GFR 30-59 ml/min   . Diabetes ([place])    type 1 DM  . Diabetic neuropathy ([place])   . Diabetic retinopathy ([place])   . Malignant melanoma ([place])   . Pernicious anemia     Past Surgical History:  Procedure Laterality Date  . APPENDECTOMY    . CARDIAC SURGERY     triple bypass 2010  . cataract repair     bilateral  . CORONARY ARTERY BYPASS GRAFT    . dupuytrens contracture     release on left little finger  . fasiectomy     for dupuytrens contracture of right hand  . FOOT SURGERY    . TOE AMPUTATION  2012   great toe on right foot due to melanoma  . TONSILLECTOMY      Family History  Problem Relation Age of Onset  . Pulmonary embolism Mother   . CVA Father    Social History:  reports that he has quit smoking. His smokeless tobacco use includes Chew. He reports that he does not drink alcohol or use drugs.  Allergies: No Known Allergies   (Not in a hospital admission)   Review of Systems  Constitutional: Negative for chills, fever and weight loss.  HENT: Negative.   Eyes: Negative.   Respiratory: Negative.   Cardiovascular: Negative.   Gastrointestinal: Positive for abdominal pain and nausea. Negative for blood in stool, constipation, diarrhea, heartburn, melena and vomiting.  Genitourinary:  Negative.   Musculoskeletal: Negative.   Skin: Negative.   Neurological: Negative.   Endo/Heme/Allergies: Negative.   Psychiatric/Behavioral: Negative.      Physical Exam:  BP (!) 97/36   Pulse 64   Temp 97.6 F (36.4 C) (Oral)   Resp (!) 30   Ht 6' 2"  (1.88 m)   Wt 230 lb (104.3 kg)   SpO2 96%   BMI 29.53 kg/m   Physical Exam  Constitutional: He is oriented to person, place, and time. He appears distressed.  Patient acutely ill writhing in pain with eyes clenched closed  HENT:  Head: Normocephalic and atraumatic.  Eyes: Pupils are equal, round, and reactive to light. Right eye exhibits no discharge. Left eye exhibits no discharge. No scleral icterus.  Neck: Normal range of motion.  Cardiovascular: Normal rate, regular rhythm and normal heart sounds.   Pulmonary/Chest: Effort normal and breath sounds normal. No respiratory distress. He has no wheezes. He has no rales.  Midline sternotomy scar  Abdominal: He exhibits distension. There is tenderness. There is rebound and guarding.  Distended and tympanitic with severe tenderness including percussion or rebound tenderness in all quadrants guarding is present  Genitourinary: Penis normal. No discharge found.  Musculoskeletal: Normal range of motion. He exhibits edema. He exhibits no tenderness or deformity.  Lymphadenopathy:  He has no cervical adenopathy.  Neurological: He is alert and oriented to person, place, and time.  Skin: Skin is warm. No rash noted. He is diaphoretic. No erythema.  Psychiatric: Mood and affect normal.  Vitals reviewed.       Results for orders placed or performed during the hospital encounter of 01/29/2016 (from the past 48 hour(s))  CBC with Differential     Status: Abnormal   Collection Time: 02/13/2016  7:00 PM  Result Value Ref Range   WBC 9.4 3.8 - 10.6 K/uL   RBC 4.21 (L) 4.40 - 5.90 MIL/uL   Hemoglobin 13.6 13.0 - 18.0 g/dL   HCT 39.6 (L) 40.0 - 52.0 %   MCV 94.0 80.0 - 100.0 fL    MCH 32.2 26.0 - 34.0 pg   MCHC 34.2 32.0 - 36.0 g/dL   RDW 15.0 (H) 11.5 - 14.5 %   Platelets 136 (L) 150 - 440 K/uL   Neutrophils Relative % 60 %   Neutro Abs 5.7 1.4 - 6.5 K/uL   Lymphocytes Relative 32 %   Lymphs Abs 3.0 1.0 - 3.6 K/uL   Monocytes Relative 6 %   Monocytes Absolute 0.5 0.2 - 1.0 K/uL   Eosinophils Relative 1 %   Eosinophils Absolute 0.1 0 - 0.7 K/uL   Basophils Relative 1 %   Basophils Absolute 0.1 0 - 0.1 K/uL  Comprehensive metabolic panel     Status: Abnormal   Collection Time: 02/11/2016  7:00 PM  Result Value Ref Range   Sodium 140 135 - 145 mmol/L   Potassium 3.9 3.5 - 5.1 mmol/L   Chloride 106 101 - 111 mmol/L   CO2 26 22 - 32 mmol/L   Glucose, Bld 111 (H) 65 - 99 mg/dL   BUN 21 (H) 6 - 20 mg/dL   Creatinine, Ser 1.51 (H) 0.61 - 1.24 mg/dL   Calcium 9.1 8.9 - 10.3 mg/dL   Total Protein 6.7 6.5 - 8.1 g/dL   Albumin 3.8 3.5 - 5.0 g/dL   AST 35 15 - 41 U/L   ALT 16 (L) 17 - 63 U/L   Alkaline Phosphatase 66 38 - 126 U/L   Total Bilirubin 0.6 0.3 - 1.2 mg/dL   GFR calc non Af Amer 40 (L) >60 mL/min   GFR calc Af Amer 47 (L) >60 mL/min    Comment: (NOTE) The eGFR has been calculated using the CKD EPI equation. This calculation has not been validated in all clinical situations. eGFR's persistently <60 mL/min signify possible Chronic Kidney Disease.    Anion gap 8 5 - 15  Troponin I     Status: None   Collection Time: 02/25/2016  7:00 PM  Result Value Ref Range   Troponin I <0.03 <0.03 ng/mL  APTT     Status: None   Collection Time: 02/07/2016  7:00 PM  Result Value Ref Range   aPTT 34 24 - 36 seconds  Protime-INR     Status: None   Collection Time: 02/13/2016  7:00 PM  Result Value Ref Range   Prothrombin Time 14.7 11.4 - 15.2 seconds   INR 1.14   Lactic acid, plasma     Status: Abnormal   Collection Time: 02/07/2016  7:17 PM  Result Value Ref Range   Lactic Acid, Venous 2.4 (HH) 0.5 - 1.9 mmol/L    Comment: CRITICAL RESULT CALLED TO, READ BACK BY AND  VERIFIED WITH REBECCA LYNN AT 1948 02/14/2016.PMH  Glucose, capillary  Status: None   Collection Time: 02/01/2016  7:21 PM  Result Value Ref Range   Glucose-Capillary 96 65 - 99 mg/dL   Dg Abdomen Acute W/chest  Result Date: 02/23/2016 CLINICAL DATA:  Began with abdominal pain today at 1600. Pale and diaphoretic. Denies vomiting. Bruising to abdomen; distention; pt in a lot of pain EXAM: DG ABDOMEN ACUTE W/ 1V CHEST COMPARISON:  10/19/2008 FINDINGS: Mildly prominent loops of small bowel are noted without significant dilation. There are several air-fluid levels on the erect view. This suggests an adynamic ileus. No convincing obstruction. No free air. Abdominal and pelvic soft tissues are unremarkable. 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 Cardiac silhouette is normal in size. No mediastinal or hilar masses. Clear lungs. IMPRESSION: 1. No convincing bowel obstruction and no free air. 2. Mildly prominent small bowel with air-fluid levels suggests an adynamic ileus. 3. No acute cardiopulmonary disease. Electronically Signed   By: Lajean Manes M.D.   On: 02/18/2016 19:41     Assessment/Plan  CT scan is personally reviewed showing pneumatosis intestinalis and severe and profound pneumobilia. His abdominal exam suggest an intra-abdominal catastrophe. He is not acidotic as of yet a lactate level has not been drawn but his bicarbonate is normal.  I discussed with the patient and his family members the likelihood of this causing his demise untreated. The deep for emergent surgery is discussed. The rationale for this been discussed. They have not had any CODE STATUS or living will discussions.  Mind he will remain a full code. And he will be taken the operating room for exploratory laparotomy. I reviewed for the family members and the patient the fact that I may not be able to do anything as this likely represents a large amount of bowel being dead or necrosis. His surprising however that he does not have acidosis  as yet. The risks of bleeding and infection recurrent disease inability to treat this disease resulting in his demise were all discussed with them they understood and agreed with this plan. Staff was present to discuss patient with Dr. Allen Kell, MD, FACS

## 2016-02-04 NOTE — Anesthesia Preprocedure Evaluation (Signed)
Anesthesia Evaluation  Patient identified by MRN, date of birth, ID band Patient awake    Reviewed: Allergy & Precautions, NPO status , Patient's Chart, lab work & pertinent test results  Airway Mallampati: III  TM Distance: >3 FB     Dental  (+) Upper Dentures, Lower Dentures   Pulmonary former smoker,    + rhonchi        Cardiovascular + CAD  Normal cardiovascular exam     Neuro/Psych  Neuromuscular disease negative psych ROS   GI/Hepatic Bowel ischemia   Endo/Other  diabetes, Well Controlled  Renal/GU Renal InsufficiencyRenal disease  negative genitourinary   Musculoskeletal   Abdominal (+)  Abdomen: rigid and tender. Bowel sounds: decreased.  Peds negative pediatric ROS (+)  Hematology  (+) anemia ,   Anesthesia Other Findings   Reproductive/Obstetrics                            Anesthesia Physical Anesthesia Plan  ASA: IV and emergent  Anesthesia Plan: General   Post-op Pain Management:    Induction: Intravenous, Rapid sequence and Cricoid pressure planned  Airway Management Planned: Oral ETT  Additional Equipment:   Intra-op Plan:   Post-operative Plan: Post-operative intubation/ventilation  Informed Consent: I have reviewed the patients History and Physical, chart, labs and discussed the procedure including the risks, benefits and alternatives for the proposed anesthesia with the patient or authorized representative who has indicated his/her understanding and acceptance.   Dental advisory given  Plan Discussed with: Surgeon  Anesthesia Plan Comments: (Critically ill patient.  Will induce with Etomidate.  Difficult to palpate peripheral pulses)       Anesthesia Quick Evaluation

## 2016-02-04 NOTE — ED Triage Notes (Signed)
Began with abdominal pain today at 1600. Pale and diaphoretic. Denies vomiting. Bruising to abdomen. Distended.

## 2016-02-04 NOTE — ED Notes (Signed)
Patient in bathroom in triage and back to wheelchair.  AAOx3.  Moved patient from bathroom to triage room 3.  Patient now in and out of responsiveness, pale, diaphoretic.  Vital signs taken.  SBP 94/51.  Patient taken to ED 4.  Family notified.

## 2016-02-04 NOTE — ED Provider Notes (Signed)
[facility] Emergency Department Provider Note   ____________________________________________   I have reviewed the triage vital signs and the nursing notes.   HISTORY  Chief Complaint Abdominal Pain   History limited by: Not Limited   HPI Matthew Mcguire is a 82 y.o. male who presents to the emergency department today because of concern for abdominal pain. States it started today around lunch time. It is severe. It started up towards his epigastric region and then migrated down to the middle of his stomach and is now located diffusely throughout his stomach. Has been accompanied by nausea and vomiting.     Past Medical History:  Diagnosis Date  . CAD (coronary artery disease)    s/p CABG  . CKD (chronic kidney disease) stage 3, GFR 30-59 ml/min   . Diabetes ([place])    type 1 DM  . Diabetic neuropathy ([place])   . Diabetic retinopathy ([place])   . Malignant melanoma ([place])   . Pernicious anemia     Patient Active Problem List   Diagnosis Date Noted  . Foot ulcer ([place]) 02/20/2015    Past Surgical History:  Procedure Laterality Date  . APPENDECTOMY    . cataract repair     bilateral  . CORONARY ARTERY BYPASS GRAFT    . dupuytrens contracture     release on left little finger  . fasiectomy     for dupuytrens contracture of right hand  . FOOT SURGERY    . TOE AMPUTATION  2012   great toe on right foot due to melanoma  . TONSILLECTOMY      Prior to Admission medications   Medication Sig Start Date End Date Taking? Authorizing Provider  acetaminophen (TYLENOL) 325 MG tablet Take 2 tablets (650 mg total) by mouth every 6 (six) hours as needed for mild pain (or Fever >/= 101). 02/23/15   Idelle Crouch, MD  aspirin EC 81 MG tablet Take 81 mg by mouth daily.    Historical Provider, MD  Cholecalciferol (VITAMIN D-1000 MAX ST) 1000 UNITS tablet Take 1,000 Units by mouth daily.    Historical Provider, MD  cyanocobalamin (,VITAMIN B-12,) 1000 MCG/ML  injection Inject 1,000 mcg into the muscle every 30 (thirty) days. 02/19/15   Historical Provider, MD  doxycycline (VIBRA-TABS) 100 MG tablet Take 1 tablet (100 mg total) by mouth every 12 (twelve) hours. 02/23/15   Idelle Crouch, MD  gabapentin (NEURONTIN) 400 MG capsule Take 800 mg by mouth 3 (three) times daily.    Historical Provider, MD  insulin glargine (LANTUS) 100 UNIT/ML injection Inject 30 Units into the skin at bedtime.    Historical Provider, MD  insulin lispro (HUMALOG) 100 UNIT/ML injection Inject 10 Units into the skin 3 (three) times daily before meals. And sliding scale depending on blood sugar    Historical Provider, MD  meloxicam (MOBIC) 7.5 MG tablet Take 7.5 mg by mouth daily. 01/31/15   Historical Provider, MD  ondansetron (ZOFRAN) 4 MG tablet Take 1 tablet (4 mg total) by mouth every 6 (six) hours as needed for nausea. 02/23/15   Idelle Crouch, MD  silver sulfADIAZINE (SILVADENE) 1 % cream Apply topically daily. 02/23/15   Idelle Crouch, MD    Allergies Review of patient's allergies indicates no known allergies.  Family History  Problem Relation Age of Onset  . Pulmonary embolism Mother   . CVA Father     Social History Social History  Substance Use Topics  . Smoking status: Former  Smoker  . Smokeless tobacco: Current User    Types: Chew  . Alcohol use No    Review of Systems  Constitutional: Negative for fever. Cardiovascular: Negative for chest pain. Respiratory: Negative for shortness of breath. Gastrointestinal: Positive for abdominal pain, vomiting and diarrhea. Neurological: Negative for headaches, focal weakness or numbness.   10-point ROS otherwise negative.  ____________________________________________   PHYSICAL EXAM:  VITAL SIGNS: ED Triage Vitals  Enc Vitals Group     BP 02/10/2016 1904 (!) 91/23     Pulse Rate 01/31/2016 1904 64     Resp 02/23/2016 1904 (!) 30     Temp 02/10/2016 1904 97.6 F (36.4 C)     Temp Source 02/10/2016  1904 Oral     SpO2 02/21/2016 1904 96 %     Weight 02/12/2016 1902 230 lb (104.3 kg)     Height 02/24/2016 1902 6\' 2"  (1.88 m)     Head Circumference --      Peak Flow --      Pain Score 02/04/16 1903 10   Constitutional: Alert and oriented. Appears extremely uncomfortable.  Eyes: Conjunctivae are normal. Normal extraocular movements. ENT   Head: Normocephalic and atraumatic.   Nose: No congestion/rhinnorhea.   Mouth/Throat: Mucous membranes are moist.   Neck: No stridor. Hematological/Lymphatic/Immunilogical: No cervical lymphadenopathy. Cardiovascular: Normal rate, regular rhythm.  No murmurs, rubs, or gallops. Respiratory: Tachypnea. Lungs clear to auscultation. Gastrointestinal: Distended. Diffusely tender to palpation. Tympanitic.  Genitourinary: Deferred Musculoskeletal: Normal range of motion in all extremities. No lower extremity edema. Neurologic:  Normal speech and language. No gross focal neurologic deficits are appreciated.  Skin:  Skin is warm, dry and intact. Small bruise to right side of stomach (pt states this is where he injects his medication)   ____________________________________________    LABS (pertinent positives/negatives)  Labs Reviewed  CBC WITH DIFFERENTIAL/PLATELET - Abnormal; Notable for the following:       Result Value   RBC 4.21 (*)    HCT 39.6 (*)    RDW 15.0 (*)    Platelets 136 (*)    All other components within normal limits  COMPREHENSIVE METABOLIC PANEL - Abnormal; Notable for the following:    Glucose, Bld 111 (*)    BUN 21 (*)    Creatinine, Ser 1.51 (*)    ALT 16 (*)    GFR calc non Af Amer 40 (*)    GFR calc Af Amer 47 (*)    All other components within normal limits  LACTIC ACID, PLASMA - Abnormal; Notable for the following:    Lactic Acid, Venous 2.4 (*)    All other components within normal limits  TROPONIN I  APTT  PROTIME-INR  GLUCOSE, CAPILLARY  CBG MONITORING, ED      ____________________________________________   EKG  I, Nance Pear, attending physician, personally viewed and interpreted this EKG  EKG Time: 1859 Rate: 74 Rhythm: normal sinus rhythm Axis: normal Intervals: qtc 456 QRS: RBBB ST changes: no st elevation Impression: abnormal ekg   ____________________________________________    RADIOLOGY  Acute abd IMPRESSION:  1. No convincing bowel obstruction and no free air.  2. Mildly prominent small bowel with air-fluid levels suggests an  adynamic ileus.  3. No acute cardiopulmonary disease.     CT angio IMPRESSION:  VASCULAR    No evidence of aortic aneurysm or dissection. Extensive calcific  atherosclerotic disease. Extensive mesenteric and portal venous gas.    NON-VASCULAR    Extensive portal venous gas throughout the  liver. Small amount of  free fluid in the abdomen and pelvis. Extensive pneumatosis of the  small bowel with small bowel wall thickening and mild dilatation.  Changes are worrisome for bowel ischemia. Lymphadenopathy in the  right groin nonspecific etiology. Consider inflammatory or  metastatic causes.     ____________________________________________   PROCEDURES  Procedures  CRITICAL CARE Performed by: Nance Pear   Total critical care time: 45 minutes  Critical care time was exclusive of separately billable procedures and treating other patients.  Critical care was necessary to treat or prevent imminent or life-threatening deterioration.  Critical care was time spent personally by me on the following activities: development of treatment plan with patient and/or surrogate as well as nursing, discussions with consultants, evaluation of patient's response to treatment, examination of patient, obtaining history from patient or surrogate, ordering and performing treatments and interventions, ordering and review of laboratory studies, ordering and review of radiographic  studies, pulse oximetry and re-evaluation of patient's condition.  ____________________________________________   INITIAL IMPRESSION / ASSESSMENT AND PLAN / ED COURSE  Pertinent labs & imaging results that were available during my care of the patient were reviewed by me and considered in my medical decision making (see chart for details).  Patient presented to the emergency department with abdominal pain that started today. On exam patient appeared extremely uncomfortable. Given discomfort and abdominal tenderness an acute abdomen x-ray series was ordered. No obvious bowel gas abnormalities. Thus CT angio was performed to evaluate for AAA. While the aorta was okay, there was diffuse concerning findings of intramural gas as well as pneumobilia. Surgery was consulted and the patient was emergently taken to the OR. While here in the emergency department the patient became hypotensive so pain control was unable to be fully realized.  ____________________________________________   FINAL CLINICAL IMPRESSION(S) / ED DIAGNOSES  Final diagnoses:  Generalized abdominal pain  Pneumobilia Pneumotosis Intestanalis   Note: This dictation was prepared with Dragon dictation. Any transcriptional errors that result from this process are unintentional    Nance Pear, MD 22-Feb-2016 1614

## 2016-02-04 NOTE — ED Notes (Signed)
Dr. Burt Knack in to see pt and told pt that his options. Pt consented to exploratory laparotomy.

## 2016-02-04 NOTE — Transfer of Care (Signed)
Immediate Anesthesia Transfer of Care Note  Patient: Matthew Mcguire  Procedure(s) Performed: Procedure(s): EXPLORATORY LAPAROTOMY (N/A)  Patient Location: ICU  Anesthesia Type:General  Level of Consciousness: sedated and Patient remains intubated per anesthesia plan  Airway & Oxygen Therapy: Patient remains intubated per anesthesia plan and Patient placed on Ventilator (see vital sign flow sheet for setting)  Post-op Assessment: Report given to RN and Post -op Vital signs reviewed and stable  Post vital signs: Reviewed and stable  Last Vitals:  Vitals:   02/13/2016 1904 02/21/2016 1949  BP: (!) 91/23 (!) 97/36  Pulse: 64   Resp: (!) 30   Temp: 36.4 C     Last Pain:  Vitals:   01/31/2016 1953  TempSrc:   PainSc: 10-Worst pain ever         Complications: no anesthesia complications.  Patient remains intubated

## 2016-02-05 MED ORDER — SODIUM CHLORIDE 0.9% FLUSH: 3.0000 mL | Freq: Two times a day (BID) | INTRAVENOUS | Status: DC

## 2016-02-05 MED ORDER — HALOPERIDOL 0.5 MG PO TABS: 0.5000 mg | ORAL_TABLET | ORAL | Status: DC | PRN

## 2016-02-05 MED ORDER — ONDANSETRON 4 MG PO TBDP: 4.0000 mg | ORAL_TABLET | Freq: Four times a day (QID) | ORAL | Status: DC | PRN

## 2016-02-05 MED ORDER — GLYCOPYRROLATE 0.2 MG/ML IJ SOLN: 0.2000 mg | INTRAMUSCULAR | Status: DC | PRN

## 2016-02-05 MED ORDER — SODIUM CHLORIDE 0.9 % IV SOLN: 250.0000 mL | INTRAVENOUS | Status: DC | PRN

## 2016-02-05 MED ORDER — HALOPERIDOL LACTATE 5 MG/ML IJ SOLN: 0.5000 mg | INTRAMUSCULAR | Status: DC | PRN

## 2016-02-05 MED ORDER — LORAZEPAM 2 MG/ML IJ SOLN
0.5000 mg | INTRAMUSCULAR | Status: DC
  Administered 2016-02-05 (×2): 0.5 mg via INTRAVENOUS
  Filled 2016-02-05 (×2): qty 1

## 2016-02-05 MED ORDER — LORAZEPAM 2 MG/ML IJ SOLN: 0.5000 mg | INTRAMUSCULAR | Status: DC | PRN

## 2016-02-05 MED ORDER — ACETAMINOPHEN 325 MG PO TABS: 650.0000 mg | ORAL_TABLET | Freq: Four times a day (QID) | ORAL | Status: DC | PRN

## 2016-02-05 MED ORDER — LORAZEPAM 2 MG/ML IJ SOLN
INTRAMUSCULAR | Status: AC
  Filled 2016-02-05: qty 1

## 2016-02-05 MED ORDER — ONDANSETRON HCL 4 MG/2ML IJ SOLN: 4.0000 mg | INTRAMUSCULAR | Status: DC | PRN

## 2016-02-05 MED ORDER — GLYCOPYRROLATE 1 MG PO TABS: 1.0000 mg | ORAL_TABLET | ORAL | Status: DC | PRN

## 2016-02-05 MED ORDER — HALOPERIDOL LACTATE 2 MG/ML PO CONC
0.5000 mg | ORAL | Status: DC | PRN
  Filled 2016-02-05: qty 0.3

## 2016-02-05 MED ORDER — ACETAMINOPHEN 650 MG RE SUPP: 650.0000 mg | Freq: Four times a day (QID) | RECTAL | Status: DC | PRN

## 2016-02-05 MED ORDER — POLYVINYL ALCOHOL 1.4 % OP SOLN
1.0000 [drp] | Freq: Four times a day (QID) | OPHTHALMIC | Status: DC | PRN
  Filled 2016-02-05: qty 15

## 2016-02-05 MED ORDER — FENTANYL CITRATE (PF) 100 MCG/2ML IJ SOLN
25.0000 ug | INTRAMUSCULAR | Status: DC | PRN
  Administered 2016-02-05: 25 ug via INTRAVENOUS
  Filled 2016-02-05: qty 2

## 2016-02-05 MED ORDER — SODIUM CHLORIDE 0.9% FLUSH: 3.0000 mL | INTRAVENOUS | Status: DC | PRN

## 2016-02-05 MED ORDER — BIOTENE DRY MOUTH MT LIQD: 15.0000 mL | OROMUCOSAL | Status: DC | PRN

## 2016-02-05 MED ORDER — ONDANSETRON HCL 4 MG/2ML IJ SOLN: 4.0000 mg | Freq: Four times a day (QID) | INTRAMUSCULAR | Status: DC | PRN

## 2016-02-05 MED ORDER — SODIUM CHLORIDE 0.9 % IV SOLN
12.5000 mg | Freq: Four times a day (QID) | INTRAVENOUS | Status: DC | PRN
  Filled 2016-02-05: qty 0.5

## 2016-02-06 ENCOUNTER — Encounter: Payer: Self-pay | Admitting: Surgery

## 2016-02-06 LAB — GLUCOSE, CAPILLARY: GLUCOSE-CAPILLARY: 97 mg/dL (ref 65–99)

## 2016-02-29 NOTE — Progress Notes (Signed)
Initial Nutrition Assessment  DOCUMENTATION CODES:   Obesity unspecified  INTERVENTION:     NUTRITION DIAGNOSIS:   Inadequate oral intake related to inability to eat as evidenced by NPO status.    GOAL:   Provide needs based on ASPEN/SCCM guidelines    MONITOR:   Vent status, Labs, Weight trends, I & O's  REASON FOR ASSESSMENT:   Ventilator    ASSESSMENT:   This a patient with the onset acutely of abdominal pain he pointed to the epigastrium where it started and is now in both lower quadrants especially on the right side he's never had an episode like this before nausea but no emesis he's had some loose stools today but had not gone for approximately 3 days prior.  Patient is currently intubated on ventilator support MV: 6.5 L/min Temp (24hrs), Avg:97.9 F (36.6 C), Min:97.5 F (36.4 C), Max:98.5 F (36.9 C)  Propofol: none  Diet Order:  Diet NPO time specified  Skin:  Reviewed, no issues  Last BM:  PTA  Height:   Ht Readings from Last 1 Encounters:  02/19/2016 6' (1.829 m)    Weight:   Wt Readings from Last 1 Encounters:  02/09/2016 251 lb 15.8 oz (114.3 kg)    Ideal Body Weight:  80.9 kg  BMI:  Body mass index is 34.18 kg/m.  Estimated Nutritional Needs:   Kcal:  1257-1600 calories  Protein:  162 gm  Fluid:  >/= 1.3L  EDUCATION NEEDS:   No education needs identified at this time

## 2016-02-29 NOTE — Progress Notes (Signed)
[place] donor service notified of  Patient's death.  CDS representative Tonye Becket.  Patient is a full release.

## 2016-02-29 NOTE — Progress Notes (Signed)
Extubated to 2L nasal cannula.  Patient not talking.  Mild labored breathing with RR mid 20's.  Wife at bedside.  Patient moving his arms but not following commands.  Continuing to monitor.

## 2016-02-29 NOTE — Progress Notes (Signed)
82 yr old with abdominal catastrophe and ischemic bowel that is not survivable.  The family wishes for the patient to be as comfortable as possible.  Patient extubated today and will provide comfort care for the patient.  Pain and agitation medications ordered.  Palliative care consulted but not available today.  Patient will likely expire soon.

## 2016-02-29 NOTE — Progress Notes (Signed)
Asystole per cardiac monitor, this RN and Adam, RN auscultated for heart tones for a full minute and no heart tones heard. Time of death pronounced at 1305.  Patient's wife at bedside along with other family and friends.  Chaplin at bedside as well.  RN made Dr. Azalee Course and Malachy Mood, nursing supervisor aware that patient passed away.

## 2016-02-29 NOTE — Progress Notes (Addendum)
Chaplain responded to ICU 7, patient passing. Chaplain provided a compassionate presence and emotional and spiritual support to and for the family.  Minerva Fester [phone]

## 2016-02-29 NOTE — Discharge Summary (Signed)
Physician Discharge Summary  Patient ID: Matthew Mcguire MRN: [id number] DOB/AGE: 01-Jan-1929 82 y.o.  Admit date: 02/03/2016 Discharge date: 02/13/2016   Discharge Diagnoses:  Active Problems:   Ischemia, bowel [facility])   Procedures  Exploratory laparotomy  Hospital Course: This patient presented the hospital with clear signs of an acute abdomen and an intra-abdominal catastrophe. He was taken the operating room emergently where extensive ischemic bowel was identified. From the near terminal ileum to the mid jejunum was necrotic as were 2 separate 10 cm areas proximal to that suggesting embolic phenomenon. While the patient was under anesthesia the family was approached concerning the patient's unlikely ability to recover from this. At that point it was decided to place the patient on comfort care and make him DO NOT RESUSCITATE status. He was transferred to the ICU on the ventilator where palliative care was consult and for comfort care. Ultimately his demise him about as expected secondary to acidosis and sepsis. He was pronounced by the nursing staff.  Consults: Palliative care  Disposition: 20-Expired     Medication List    ASK your doctor about these medications   acetaminophen 325 MG tablet Commonly known as:  TYLENOL Take 2 tablets (650 mg total) by mouth every 6 (six) hours as needed for mild pain (or Fever >/= 101).   aspirin EC 81 MG tablet Take 81 mg by mouth daily.   cyanocobalamin 1000 MCG/ML injection Commonly known as:  (VITAMIN B-12) Inject 1,000 mcg into the muscle every 30 (thirty) days.   doxycycline 100 MG tablet Commonly known as:  VIBRA-TABS Take 1 tablet (100 mg total) by mouth every 12 (twelve) hours.   gabapentin 400 MG capsule Commonly known as:  NEURONTIN Take 800 mg by mouth 3 (three) times daily.   insulin lispro 100 UNIT/ML injection Commonly known as:  HUMALOG Inject 10 Units into the skin 3 (three) times daily before meals. And sliding  scale depending on blood sugar   LANTUS 100 UNIT/ML injection Generic drug:  insulin glargine Inject 30 Units into the skin at bedtime.   meloxicam 7.5 MG tablet Commonly known as:  MOBIC Take 7.5 mg by mouth daily.   ondansetron 4 MG tablet Commonly known as:  ZOFRAN Take 1 tablet (4 mg total) by mouth every 6 (six) hours as needed for nausea.   silver sulfADIAZINE 1 % cream Commonly known as:  SILVADENE Apply topically daily.   VITAMIN D-1000 MAX ST 1000 units tablet Generic drug:  Cholecalciferol Take 1,000 Units by mouth daily.        Florene Glen, MD, FACS

## 2016-02-29 NOTE — Progress Notes (Signed)
   02-27-16 1100  Clinical Encounter Type  Visited With Patient;Family;Patient and family together;Health care provider  Visit Type Follow-up;Psychological support;Spiritual support;Social support  Referral From Nurse;Physician  Consult/Referral To Chaplain  Recommendations Ms.Gromek will have to give consent on taking her husband off of life support measures. She has conversations with the Doctor and look to be heading in that direction now. Continued assistance and care will be vital during the end of life measures.  Spiritual Encounters  Spiritual Needs Prayer;Emotional;Grief support  Stress Factors  Patient Stress Factors Health changes  Family Stress Factors Exhausted;Family relationships;Health changes;Lack of knowledge;Loss;Loss of control;Major life changes  Advance Directives (For Healthcare)  Does patient have an advance directive? No  Would patient like information on creating an advanced directive? Yes - Spiritual care consult ordered    Mr.Carrithers was admitted into the hospital yesterday and was immediately rushed to surgery in the OR. The Doctors speculated that the likelihood of him surviving surgery was slim to none. However, the patient made it through surgery but is in CCU. The Doctors have communicated the issues to the wife and are preparing her for end of life measures. I have contacted and communicated with both Pastors and family member throughout this time.

## 2016-02-29 NOTE — Anesthesia Postprocedure Evaluation (Signed)
Anesthesia Post Note  Patient: Matthew Mcguire  Procedure(s) Performed: Procedure(s) (LRB): EXPLORATORY LAPAROTOMY (N/A)  Patient location during evaluation: ICU Anesthesia Type: General Level of consciousness: patient remains intubated per anesthesia plan Pain management: pain level controlled Vital Signs Assessment: vitals unstable Respiratory status: patient remains intubated per anesthesia plan and patient on ventilator - see flowsheet for VS Cardiovascular status: unstable Anesthetic complications: no Comments: Patient on palliative care secondary to extensive compromised bowel.  Patient's family agrees    Last Vitals:  Vitals:   2016/02/26 0745 26-Feb-2016 0815  BP: (!) 78/42 (!) 88/70  Pulse: (!) 112 (!) 109  Resp: (!) 7 13  Temp:  36.9 C    Last Pain:  Vitals:   02/26/16 0815  TempSrc: Oral  PainSc:                  Matthew Mcguire

## 2016-02-29 NOTE — Progress Notes (Signed)
RT to room to extubate patient per MD order.  Patient suctioned prior to extubation, extubated and placed on 2LPM [place] without complication.  Will continue to monitor.

## 2016-02-29 DEATH — deceased

## 2017-04-30 IMAGING — DX DG ABDOMEN ACUTE W/ 1V CHEST
5 series · 5 of 5 positions shown · non-contrast
Comparison: 10/19/2008

CLINICAL DATA: Began with abdominal pain today at 8511. Pale and
diaphoretic. Denies vomiting. Bruising to abdomen; distention; pt in
a lot of pain

EXAM:
DG ABDOMEN ACUTE W/ 1V CHEST

[chest pa]
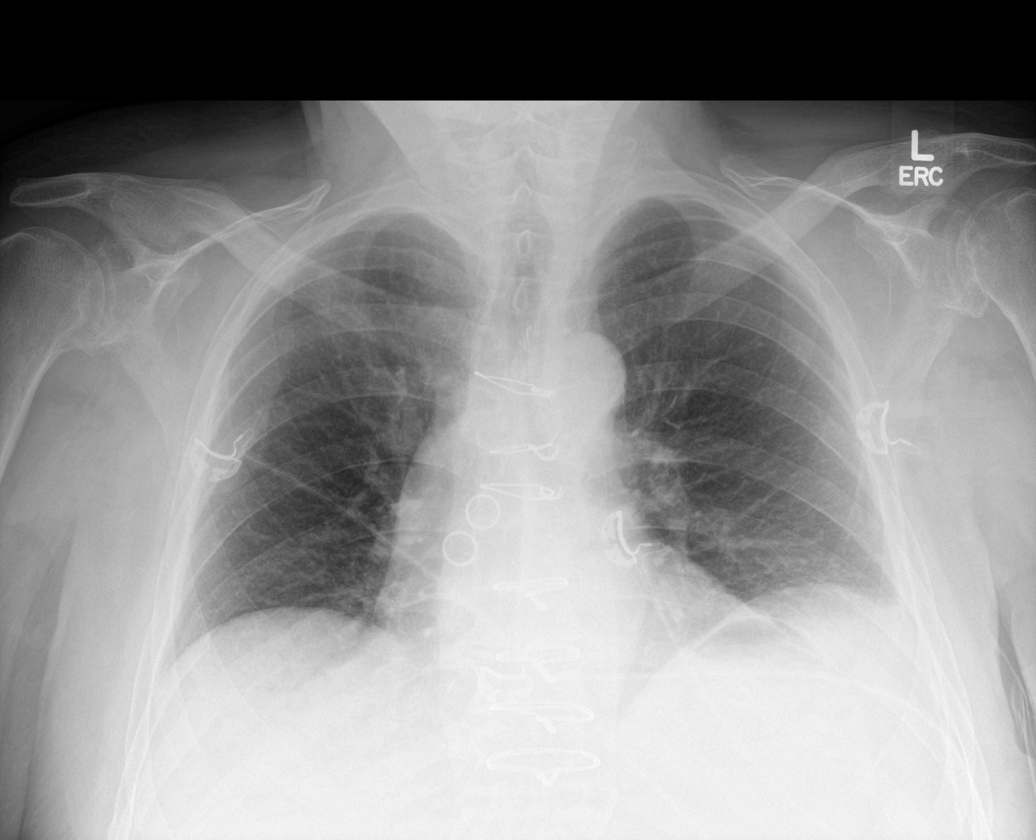

[abdomen erect]
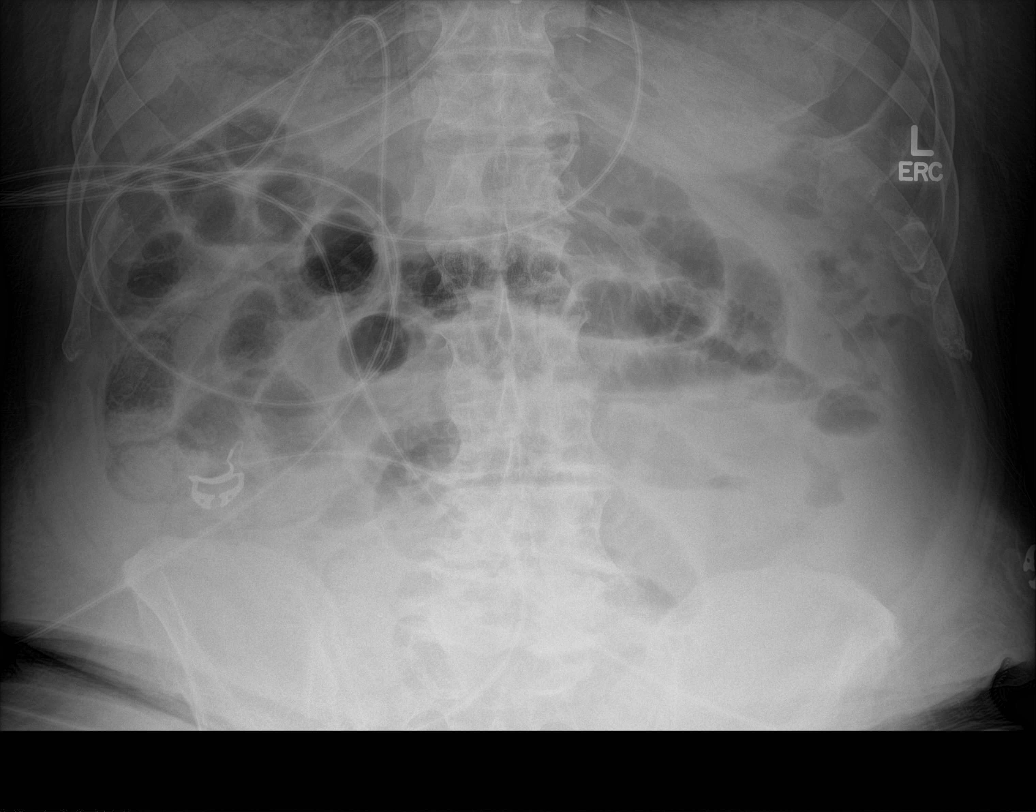

[abdomen supine (1 of 3)]
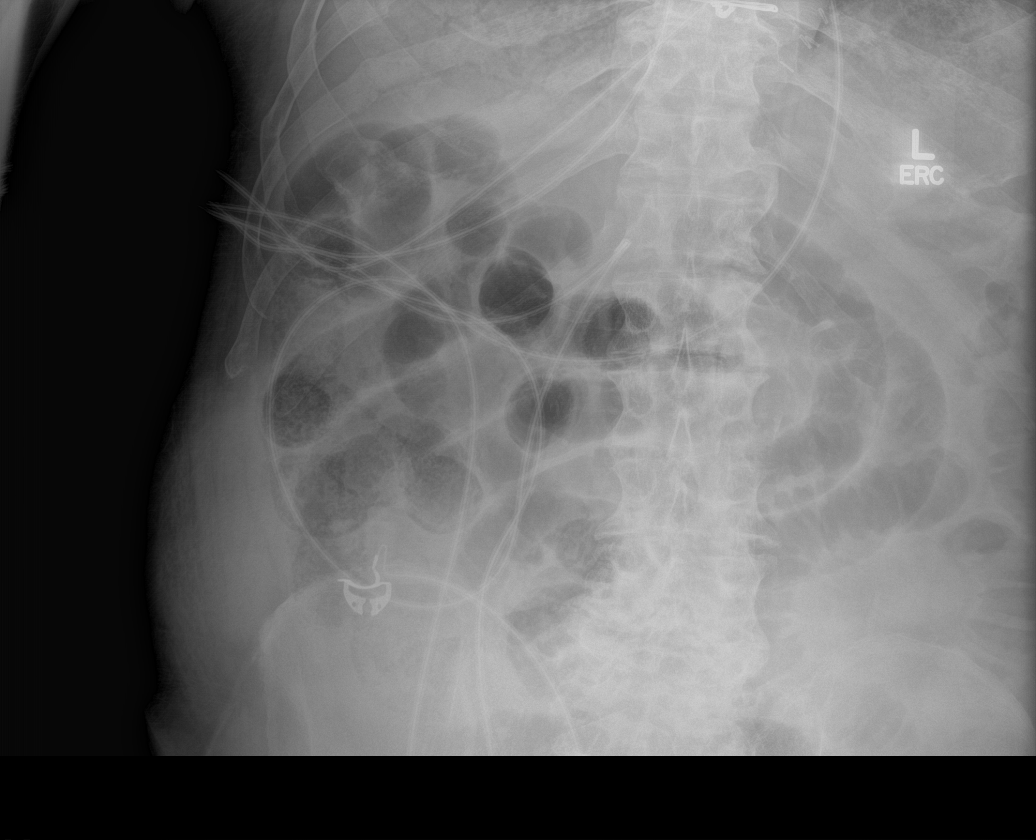

[abdomen supine (2 of 3)]
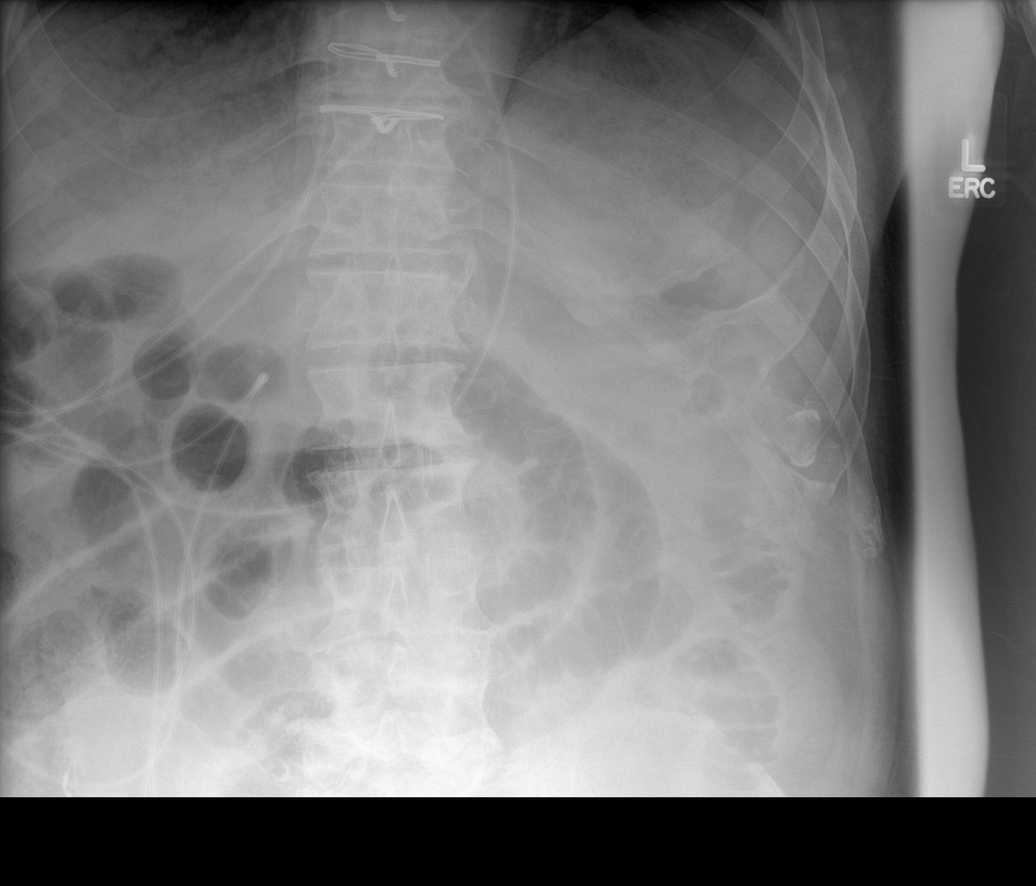

[abdomen supine (3 of 3)]
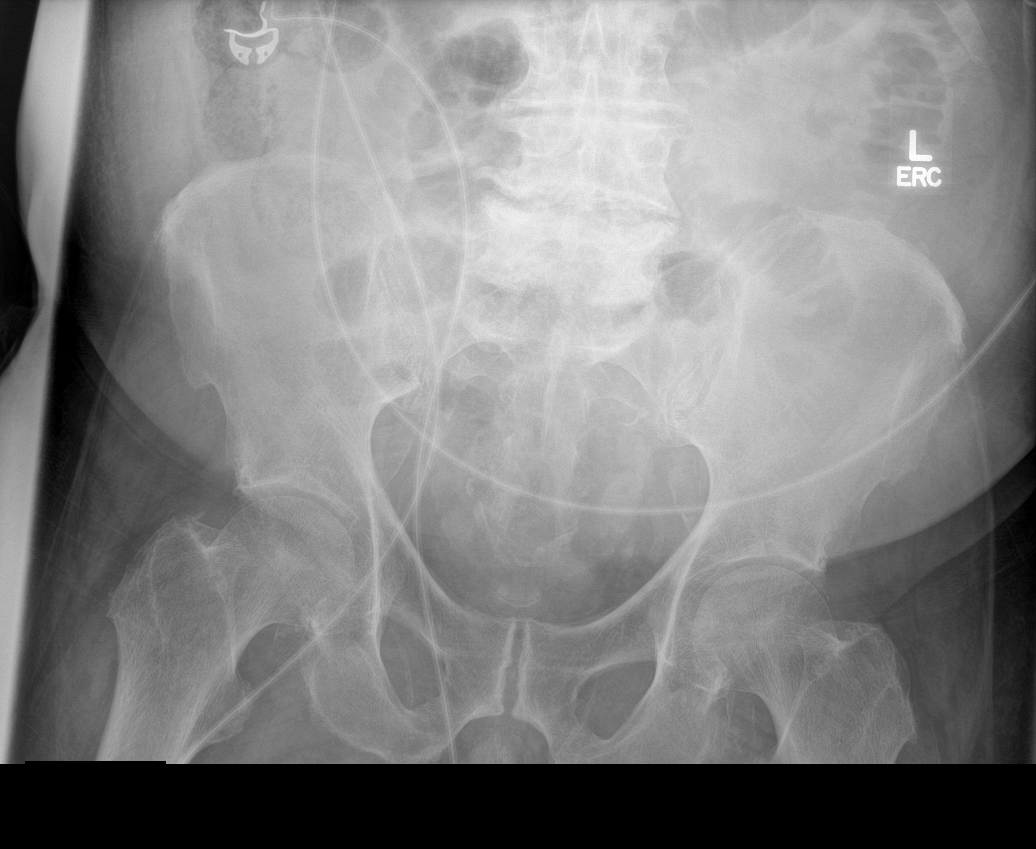

[5 of 5 positions shown; findings below may reference images not displayed]

FINDINGS: Mildly prominent loops of small bowel are noted without significant
dilation. There are several air-fluid levels on the erect view. This
suggests an adynamic ileus. No convincing obstruction.

No free air.

Abdominal and pelvic soft tissues are unremarkable.

There changes from CABG surgery. Cardiac silhouette is normal in
size. No mediastinal or hilar masses. Clear lungs.
IMPRESSION: 1. No convincing bowel obstruction and no free air.
2. Mildly prominent small bowel with air-fluid levels suggests an
adynamic ileus.
3. No acute cardiopulmonary disease.
